# Patient Record
Sex: Female | Born: 1995 | Hispanic: No | Marital: Single | Smoking: Never smoker
Health system: Southern US, Community
[De-identification: ages and names within clinical notes are randomized; demographics above are authoritative.]

## PROBLEM LIST (undated history)

## (undated) ENCOUNTER — Inpatient Hospital Stay (HOSPITAL_COMMUNITY): Payer: Self-pay

## (undated) DIAGNOSIS — K219 Gastro-esophageal reflux disease without esophagitis: Secondary | ICD-10-CM

## (undated) DIAGNOSIS — G43909 Migraine, unspecified, not intractable, without status migrainosus: Secondary | ICD-10-CM

## (undated) DIAGNOSIS — K8020 Calculus of gallbladder without cholecystitis without obstruction: Secondary | ICD-10-CM

## (undated) HISTORY — PX: TUMOR REMOVAL: SHX12

## (undated) HISTORY — DX: Gastro-esophageal reflux disease without esophagitis: K21.9

---
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
## 2013-01-28 ENCOUNTER — Telehealth: Payer: Self-pay | Admitting: Pediatrics

## 2013-01-28 DIAGNOSIS — G43009 Migraine without aura, not intractable, without status migrainosus: Secondary | ICD-10-CM

## 2013-01-28 NOTE — Telephone Encounter (Signed)
Headache calendar from March 2014 on [place]. 31 days were recorded.  0 days were headache free.  8 days were associated with tension type headaches, 8 required treatment.  There were 23 days of migraines, 8 were severe.

## 2013-01-31 MED ORDER — DIVALPROEX SODIUM ER 500 MG PO TB24: 1500.0000 mg | ORAL_TABLET | Freq: Every day | ORAL | Status: DC

## 2013-01-31 NOTE — Telephone Encounter (Signed)
I spoke with mother.  She said that the patient is hungry, but is actually doing fairly well as far as her weight.  Divalproex will be increased to 3 tablets at nighttime.I asked that another calendar be sent for May.

## 2013-02-10 ENCOUNTER — Encounter: Payer: Self-pay | Admitting: *Deleted

## 2013-03-04 ENCOUNTER — Encounter (HOSPITAL_COMMUNITY): Payer: Self-pay | Admitting: Emergency Medicine

## 2013-03-04 ENCOUNTER — Emergency Department (HOSPITAL_COMMUNITY)
Admission: EM | Admit: 2013-03-04 | Discharge: 2013-03-05 | Disposition: A | Discharge status: Home / Self Care | Payer: Medicaid Other | Attending: Emergency Medicine | Admitting: Emergency Medicine

## 2013-03-04 DIAGNOSIS — R110 Nausea: Secondary | ICD-10-CM | POA: Insufficient documentation

## 2013-03-04 DIAGNOSIS — G43909 Migraine, unspecified, not intractable, without status migrainosus: Secondary | ICD-10-CM | POA: Insufficient documentation

## 2013-03-04 DIAGNOSIS — H53149 Visual discomfort, unspecified: Secondary | ICD-10-CM | POA: Insufficient documentation

## 2013-03-04 DIAGNOSIS — Z79899 Other long term (current) drug therapy: Secondary | ICD-10-CM | POA: Insufficient documentation

## 2013-03-04 NOTE — ED Notes (Signed)
PT. REPORTS MIGRAINE HEADACHE FOR 5 DAYS UNRELIEVED BY PRESCRIPTION MIGRAINE MEDICATIONS , DENIES NAUSEA OR VOMITTING .

## 2013-03-05 MED ORDER — KETOROLAC TROMETHAMINE 30 MG/ML IJ SOLN
60.0000 mg | Freq: Once | INTRAMUSCULAR | Status: DC
  Filled 2013-03-05: qty 2

## 2013-03-05 MED ORDER — DIPHENHYDRAMINE HCL 50 MG/ML IJ SOLN
25.0000 mg | Freq: Once | INTRAMUSCULAR | Status: DC
  Filled 2013-03-05: qty 1

## 2013-03-05 MED ORDER — METOCLOPRAMIDE HCL 5 MG/ML IJ SOLN
10.0000 mg | Freq: Once | INTRAMUSCULAR | Status: DC
  Filled 2013-03-05: qty 2

## 2013-03-05 NOTE — ED Provider Notes (Signed)
History     CSN: [id number]  Arrival date & time 03/04/13  2138   First MD Initiated Contact with Patient 03/04/13 2343      Chief Complaint  Patient presents with  . Migraine    (Consider location/radiation/quality/duration/timing/severity/associated sxs/prior treatment) HPI 17 year old female presents to emergency room with report of persistent headache for 5 days.  Patient reports she has daily headaches, usually controlled with ibuprofen.  She also has a history of migraine headaches for which she takes Depakote, Topamax, and Imitrex as needed.  She, reports she's taken her Imitrex for the last 2 days without improvement in her headaches.  Patient denies any fever, no neck stiffness, no URI symptoms.  She reports her current headache is consistent with her normal migraines.  She is followed Dr. Sharene Skeans for same.  She was last seen by him 3 weeks ago for routine checkup.  She contacted his office today, but was unable to get an appointment.  She is complaining of photo and phonophobia, as well as nausea.  She has not had vomiting.  No weakness no numbness or focal neuro deficits.  Past Medical History  Diagnosis Date  . Migraines     Past Surgical History  Procedure Laterality Date  . Tumor removal      L foot    No family history on file.  History  Substance Use Topics  . Smoking status: Never Smoker   . Smokeless tobacco: Not on file  . Alcohol Use: No    OB History   Grav Para Term Preterm Abortions TAB SAB Ect Mult Living                  Review of Systems  See History of Present Illness; otherwise all other systems are reviewed and negative  Allergies  Review of patient's allergies indicates no known allergies.  Home Medications   Current Outpatient Rx  Name  Route  Sig  Dispense  Refill  . divalproex (DEPAKOTE ER) 500 MG 24 hr tablet   Oral   Take 3 tablets (1,500 mg total) by mouth at bedtime.   93 tablet   5   . ibuprofen (ADVIL,MOTRIN) 200 MG  tablet   Oral   Take 400 mg by mouth 3 (three) times daily as needed. For headache         . omeprazole (PRILOSEC) 20 MG capsule   Oral   Take 20 mg by mouth daily.         . ondansetron (ZOFRAN-ODT) 8 MG disintegrating tablet   Oral   Take 8 mg by mouth every 8 (eight) hours as needed for nausea.         . SUMAtriptan (IMITREX) 50 MG tablet   Oral   Take 50 mg by mouth as needed. Take one tablet by mouth at onset of migraine with 400 mg of ibuprofen         . topiramate (TOPAMAX) 25 MG tablet   Oral   Take 50 mg by mouth at bedtime.            BP 124/70  Pulse 82  Temp(Src) 98.8 F (37.1 C) (Oral)  Resp 16  SpO2 98%  Physical Exam  Nursing note and vitals reviewed. Constitutional: She is oriented to person, place, and time. She appears well-developed and well-nourished. No distress.  HENT:  Head: Normocephalic and atraumatic.  Right Ear: External ear normal.  Left Ear: External ear normal.  Nose: Nose normal.  Mouth/Throat: Oropharynx  is clear and moist.  Eyes: Conjunctivae and EOM are normal. Pupils are equal, round, and reactive to light.  Neck: Normal range of motion. Neck supple. No JVD present. No tracheal deviation present. No thyromegaly present.  Cardiovascular: Normal rate, regular rhythm, normal heart sounds and intact distal pulses.  Exam reveals no gallop and no friction rub.   No murmur heard. Pulmonary/Chest: Effort normal and breath sounds normal. No stridor. No respiratory distress. She has no wheezes. She has no rales. She exhibits no tenderness.  Abdominal: Soft. Bowel sounds are normal. She exhibits no distension and no mass. There is no tenderness. There is no rebound and no guarding.  Musculoskeletal: Normal range of motion. She exhibits no edema and no tenderness.  Lymphadenopathy:    She has no cervical adenopathy.  Neurological: She is alert and oriented to person, place, and time. She has normal reflexes. No cranial nerve deficit. She  exhibits normal muscle tone. Coordination normal.  Skin: Skin is warm and dry. No rash noted. She is not diaphoretic. No erythema. No pallor.  Psychiatric: She has a normal mood and affect. Her behavior is normal. Judgment and thought content normal.    ED Course  Procedures (including critical care time)  Labs Reviewed - No data to display No results found.   1. Migraine       MDM  17 year old female with her typical migraine presentation.  Will treat with Reglan Benadryl and Toradol.  Explained to her and her mother that we would most likely not be able to resolve her pain completely, but we would hope to get her metacarpal, so she was able to sleep.       Olivia Mackie, MD 03/05/13 [phone]

## 2013-03-05 NOTE — ED Notes (Signed)
See downtime charting. 

## 2013-03-06 ENCOUNTER — Telehealth: Payer: Self-pay | Admitting: Family

## 2013-03-06 DIAGNOSIS — G43001 Migraine without aura, not intractable, with status migrainosus: Secondary | ICD-10-CM

## 2013-03-06 MED ORDER — VERAPAMIL HCL 40 MG PO TABS: ORAL_TABLET | ORAL | Status: DC

## 2013-03-06 NOTE — Telephone Encounter (Signed)
Vida Roller, NP from [facility] called and said that Megan [name] called her to report that she has had a migraine for 7 days. She went to ER on 5/13 but migraine persists. She is taking Depakote ER 500mg  3 at bedtime and complained that she was gaining weight. Tresa Endo said that Karim[place] wants Dr Sharene Skeans to call her at [phone] to talk about the ongoing headaches and the weight gain. TG

## 2013-03-06 NOTE — Telephone Encounter (Signed)
The patient has gained 8 pounds since she started Depakote.  Most of this happened after we increased the dose from 1000 to1500 mg.  In addition, the patient is experiencing tremors.  She has not experienced any relief from her headaches with Depakote.  I recommended that she discontinue the medication.  We will start her on verapamil 40 mg twice daily and gradually escalate the medication at one week intervals.  As regards to treating her daily headaches we can consider switching to other triptans.  He only other option would be admission to the hospital for DHE protocol.  I described this to the patient and told her to discuss it with her mother.  The most convenient time for her would be to go into the hospital on Friday afternoon after school begins than she will only missed one day of school if the treatment extends into Monday.  She is going to talk about this with her mother and will contact me.

## 2013-03-07 ENCOUNTER — Telehealth: Payer: Self-pay | Admitting: Family

## 2013-03-07 ENCOUNTER — Inpatient Hospital Stay (HOSPITAL_COMMUNITY)
Admission: AD | Admit: 2013-03-07 | Discharge: 2013-03-09 | DRG: 103 | Disposition: A | Discharge status: Home / Self Care | Payer: Medicaid Other | Source: Ambulatory Visit | Attending: Pediatrics | Admitting: Pediatrics

## 2013-03-07 ENCOUNTER — Other Ambulatory Visit: Payer: Self-pay | Admitting: Family

## 2013-03-07 DIAGNOSIS — R161 Splenomegaly, not elsewhere classified: Secondary | ICD-10-CM | POA: Diagnosis present

## 2013-03-07 DIAGNOSIS — G43901 Migraine, unspecified, not intractable, with status migrainosus: Principal | ICD-10-CM | POA: Diagnosis present

## 2013-03-07 DIAGNOSIS — G43001 Migraine without aura, not intractable, with status migrainosus: Secondary | ICD-10-CM

## 2013-03-07 DIAGNOSIS — R110 Nausea: Secondary | ICD-10-CM | POA: Diagnosis present

## 2013-03-07 DIAGNOSIS — R1032 Left lower quadrant pain: Secondary | ICD-10-CM | POA: Diagnosis not present

## 2013-03-07 MED ORDER — DIPHENHYDRAMINE HCL 50 MG/ML IJ SOLN
25.0000 mg | Freq: Three times a day (TID) | INTRAMUSCULAR | Status: DC
  Administered 2013-03-07 – 2013-03-08 (×2): 25 mg via INTRAVENOUS
  Filled 2013-03-07 (×2): qty 1
  Filled 2013-03-07 (×3): qty 0.5

## 2013-03-07 MED ORDER — VERAPAMIL HCL 40 MG PO TABS: ORAL_TABLET | ORAL | Status: DC

## 2013-03-07 MED ORDER — SODIUM CHLORIDE 0.9 % IV SOLN
INTRAVENOUS | Status: DC
  Administered 2013-03-07: 21:00:00 via INTRAVENOUS
  Administered 2013-03-08: 1000 mL via INTRAVENOUS

## 2013-03-07 MED ORDER — DIHYDROERGOTAMINE MESYLATE 1 MG/ML IJ SOLN
1.0000 mg | Freq: Three times a day (TID) | INTRAMUSCULAR | Status: DC
  Administered 2013-03-07 – 2013-03-08 (×4): 1 mg via INTRAVENOUS
  Filled 2013-03-07 (×8): qty 1

## 2013-03-07 MED ORDER — DEXAMETHASONE SODIUM PHOSPHATE 10 MG/ML IJ SOLN: 10.0000 mg | Freq: Three times a day (TID) | INTRAMUSCULAR | Status: DC

## 2013-03-07 MED ORDER — METOCLOPRAMIDE HCL 5 MG/ML IJ SOLN: 10.0000 mg | Freq: Three times a day (TID) | INTRAMUSCULAR | Status: DC

## 2013-03-07 MED ORDER — TOPIRAMATE 25 MG PO TABS
75.0000 mg | ORAL_TABLET | Freq: Every day | ORAL | Status: DC
  Administered 2013-03-07 – 2013-03-08 (×2): 75 mg via ORAL
  Filled 2013-03-07 (×3): qty 3

## 2013-03-07 MED ORDER — ONDANSETRON 4 MG PO TBDP
8.0000 mg | ORAL_TABLET | Freq: Four times a day (QID) | ORAL | Status: DC | PRN
  Administered 2013-03-07: 8 mg via ORAL
  Filled 2013-03-07: qty 2

## 2013-03-07 NOTE — Telephone Encounter (Signed)
[facility] faxed a note to clarify instructions for Rx e-prescribed yesterday by Dr Sharene Skeans. I faxed in new Rx clarifying instructions. TG

## 2013-03-07 NOTE — H&P (Signed)
Pediatric H&P  Patient Details:  Name: Megan [name] MRN: [id number] DOB: 1996/06/22  Chief Complaint   Status Migrainus   History of the Present Illness   Megan [name] is an 17 y.o female with prolonged history of migraines who is presenting in status migrainosus for inpatient administration of DHE protocol.  Pt reports ongoing migraine for ~8 days, 8-9/10 in severity not responsive to home analgesics.  *She has not taken any medications today.*   She can not think of any inciting factors or stress.  She describes her HA as generalized, throbbing, with sensitivity to light and sound and associated with nausea, no emesis.  She does seem to have some component of tension of HA as well, which are usually relieved with rest and analgesia within 1-2 hours, but current HA have been ongoing. She currently takes Topiramate every night, and ibuprofen, sumatriptan, imitrex, and zofran PRN.     ROS: no fever, cough, congestion, no vomiting, no diarrhea.   Endorses poor appetite, but drinking well and has good UOP.  Patient Active Problem List  Active Problems:   Migraine with status migrainosus   Past Birth, Medical & Surgical History   -Full Term.  No complications. -Foot surgery x 2 for removal of benign mass? -Endorses hx of HA since 1 yrs of age.   Developmental History   Normal   Diet History   Regular Diet   Social History   Lives at home with mom and dad.  She is in the 12th grade, will graduate this year.  Parents smoke, she denies any history of tobacco, alcohol, or illicit drug use.  She is sexually active, endorses using protection every time.    Primary Care Provider  Vida Roller, FNP  Home Medications  Medication     Dose Sumatriptan PRN   Topiramate 25 mg tablet  3 tablets qhs   Prilosec   20 mg BID  Imatrex PRN    Ibuprofen   800 mg q 6 PRN  Odansetron  8 mg q 6 PRN    Allergies  No Known Allergies  Immunizations   Up to Date   Family History   Father:  Migraines, DM   Exam  BP 121/74  Pulse 88  Temp(Src) 99.5 F (37.5 C) (Oral)  Resp 17  Ht 5' (1.524 m)  Wt 60.102 kg (132 lb 8 oz)  BMI 25.88 kg/m2  SpO2 100%  LMP 01/31/2013  Ins and Outs:   Weight: 60.102 kg (132 lb 8 oz)   62%ile (Z=0.31) based on CDC 2-20 Years weight-for-age data.  General: pleasant adolescent female in NAD HEENT: MMM, nares patent, no discharge  Neck: supple, normal ROM Lymph nodes: no lymphadenopathy Chest: comfortable WOB, CTAB, no rales or wheezes Heart: nml S1S2, no murmur appreciated, brisk cap refill  Abdomen: soft, normal bowel sounds, NTND, no masses Extremities: warm and well perfused, no edema   Musculoskeletal: good ROM, no joint effusions or edema  Neurological: alert and oriented, pupils equal and reactive, EOMI, sensation intact, strength 5/5, 2+ reflexes, no LE clonus, did not assess gait   Skin: no rashes or lesions   Labs & Studies    Assessment   Megan [name] is an 17 y.o female with prolonged history of migraines who is presenting in status migrainosus for inpatient administration of DHE protocol.  Plan   1.) Status Migrainosus: -Will initiate DHE protocol: Give IV benadryl (25 mg), followed by Reglan (10 mg), wait 10-15 minutes, then administer DHE dihydroergotamine (1 mg),  followed by Decadron (10 mg).  -Assess pain scale after administration.  Can repeat this regimen every 8 hours, for total of 9 treatments.  If pain scale of 0, do not re-dose. -Once pt able to go 16 hrs without DHE, she is stable for discharge.   -Continue home Topamax 75 mg qhs  -Zofran 8 mg q 6 PRN nausea      2.) FEN/GI: -MIVF NS at 100 ml/hr given poor po intake, although well perfused on exam.  -regular diet  -Monitor Is & Os   3.) Dispo: Inpatient for management of Status Migrainosus.    Keith Rake 03/07/2013, 9:23 PM

## 2013-03-07 NOTE — H&P (Signed)
Megan [name] is an 17 year old female admitted by pediatric neurologist, Dr. Ellison Carwin for her first [facility] treatment for status migrainosis.  The patient has a long history of headaches and has an extensive home regimen of treatment. She is alert and in no acute distress. I agree with Dr. Lucita Lora assessment and plan.

## 2013-03-07 NOTE — Progress Notes (Signed)
Pt is  a direct admission from home  To be admitted under Dr Erlinda Hong cormick  E  No admissions orders at this time MD paged . awaiting Md to see  pt made comfortable in bed initial assessment done.

## 2013-03-07 NOTE — Telephone Encounter (Signed)
Mom, Megan [name] called to say that [name] discussed the telephone call and options for treatment with her parents last night. She had a bad night last night with headache and they have decided that she needs to be admitted this weekend for headache treatment. Please call Mom at [phone] to discuss and arrange this. TG

## 2013-03-07 NOTE — Telephone Encounter (Signed)
I called the pediatric floor and there are no spaces at this time.  They will contact me when there is a space.   I spoke with mother and told her that she will need to wait, and I will call her when a room opens up.

## 2013-03-07 NOTE — Progress Notes (Signed)
Pt is a direct admit from home Md paged for admission orders no orders yet initial assessment done and pt oriented to room and made comfortable. Vs stable Azzie Roup RN RN

## 2013-03-08 DIAGNOSIS — R1032 Left lower quadrant pain: Secondary | ICD-10-CM

## 2013-03-08 MED ORDER — DEXAMETHASONE SODIUM PHOSPHATE 10 MG/ML IJ SOLN
10.0000 mg | Freq: Three times a day (TID) | INTRAMUSCULAR | Status: DC
  Administered 2013-03-08 (×2): 10 mg via INTRAVENOUS
  Filled 2013-03-08 (×6): qty 1

## 2013-03-08 MED ORDER — DIPHENHYDRAMINE HCL 25 MG PO CAPS
25.0000 mg | ORAL_CAPSULE | Freq: Three times a day (TID) | ORAL | Status: DC
  Administered 2013-03-08 (×2): 25 mg via ORAL
  Filled 2013-03-08 (×5): qty 1

## 2013-03-08 MED ORDER — METOCLOPRAMIDE HCL 5 MG/ML IJ SOLN
10.0000 mg | Freq: Three times a day (TID) | INTRAMUSCULAR | Status: DC
  Administered 2013-03-08 (×2): 10 mg via INTRAVENOUS
  Filled 2013-03-08 (×7): qty 2

## 2013-03-08 NOTE — Progress Notes (Addendum)
I saw and examined the patient this morning on family centered rounds and I agree with the resident note.  17 yo admitted for DHE, found to have very specific pain in the LLQ and palpable mass, possible splenomegaly, does have a history of viral illness about 1 month ago.  Plan to continue DHE and to get abdominal U/S to further evaluate abdominal pain and possible mass. Kanya Potteiger H 03/08/2013 2:20 PM

## 2013-03-08 NOTE — Progress Notes (Signed)
Subjective: Overnight, Megan [name] continued to complain of headaches with pain ratings between 5-9. She had a pain score of 0 recorded overnight, however, the patient denies ever being pain free since admission although she does think that things are improving. She has had less nausea. She had a BM this morning that she described as liquid.   There was some confusion with the protocol overnight and the patient unfortunately did not receive Decadron or Reglan as part of her DHE protocol. This seems to have been an ordering error. She did, however, receive Benadryl and DHE. She complained of a significant amount of burning at the IV site with the DHE infusion and the IV site was changed. She required one prn dose of Zofran.  Objective: Vital signs in last 24 hours: Temp:  [98 F (36.7 C)-99.6 F (37.6 C)] 98 F (36.7 C) (05/17 1000) Pulse Rate:  [72-88] 80 (05/17 1000) Resp:  [17-18] 17 (05/17 1000) BP: (100-121)/(48-74) 111/67 mmHg (05/17 1000) SpO2:  [99 %-100 %] 99 % (05/17 1000) Weight:  [132 lb 8 oz (60.102 kg)-132 lb 15 oz (60.3 kg)] 132 lb 15 oz (60.3 kg) (05/17 0500) 63%ile (Z=0.33) based on CDC 2-20 Years weight-for-age data.  Physical Exam  Constitutional: She is oriented to person, place, and time. She appears well-developed and well-nourished. No distress.  HENT:  Head: Normocephalic and atraumatic.  Eyes: EOM are normal. Pupils are equal, round, and reactive to light.  Neck: Normal range of motion. Neck supple.  Cardiovascular: Normal rate, regular rhythm and normal heart sounds.   Respiratory: Effort normal and breath sounds normal. She has no wheezes.  GI: Bowel sounds are normal. She exhibits mass. There is splenomegaly. There is no hepatomegaly. There is tenderness. There is guarding. There is no rebound.  Musculoskeletal: Normal range of motion.  Neurological: She is alert and oriented to person, place, and time.  Skin: Skin is warm and dry. No rash noted.  Psychiatric: She  has a normal mood and affect.   Physical Exam  Constitutional: She is oriented to person, place, and time. She appears well-developed and well-nourished. No distress.  HENT:  Head: Normocephalic and atraumatic.  Eyes: EOM are normal. Pupils are equal, round, and reactive to light.  Neck: Normal range of motion. Neck supple.  Cardiovascular: Normal rate, regular rhythm and normal heart sounds.   Pulmonary/Chest: Effort normal and breath sounds normal. She has no wheezes.  Abdominal: Bowel sounds are normal. She exhibits mass. There is splenomegaly. There is no hepatomegaly. There is tenderness. There is guarding. There is no rebound.  Musculoskeletal: Normal range of motion.  Neurological: She is alert and oriented to person, place, and time.  Skin: Skin is warm and dry. No rash noted.  Psychiatric: She has a normal mood and affect.    No current facility-administered medications on file prior to encounter.   Current Outpatient Prescriptions on File Prior to Encounter  Medication Sig Dispense Refill  . ibuprofen (ADVIL,MOTRIN) 200 MG tablet Take 400 mg by mouth 3 (three) times daily as needed for headache.       Marland Kitchen omeprazole (PRILOSEC) 20 MG capsule Take 20 mg by mouth daily.      . ondansetron (ZOFRAN-ODT) 8 MG disintegrating tablet Take 8 mg by mouth every 8 (eight) hours as needed for nausea.      . SUMAtriptan (IMITREX) 50 MG tablet Take 50 mg by mouth every 2 (two) hours as needed for migraine.       . topiramate (TOPAMAX)  25 MG tablet Take 150 mg by mouth at bedtime.        Assessment/Plan:   Megan [name] is an 17 yo female with prolonged history of migraines who is presenting in status migrainosus for inpatient administration of DHE protocol. She is s/p 2 doses although these were incomplete doses as stated above. Is continuing to have pain although her pain ratings have improved to 5 on scale of 10. Will continue protocol as below.   1. Status Migrainosus:  - Continue DHE protocol and  this has been clarified with staff as such:  1. Give PO Benadryl (25mg ) followed by Reglan (10mg ).  2. Wait 10-15 minutes and administer DHE dihydroergotamine (1mg ).  3. Follow with Decadron (10mg ). - Assess pain scale after administration. Can repeat this regimen every 8 hours for a total of 9 treatments. If pain scale of 0, do not re-dose.  - Once patient is able to go 16 hours without DHE, she is stable for discharge.  - Continue home Topamax 75mg  qhs - Zofran 8mg  q8 hrs prn for nausea  2. Abdominal Tenderness: Palpable splenomegaly felt on exam with diffuse tenderness although L>R quadrant. No history of abdominal pathology or hemolytic disease.  - Abdominal U/s to evaluate for HSM or other intra-abdominal process  3. FEN/GI:  - MIVF NS at 100cc/hr given poor PO intake, have encouraged patient to increase fluid intake today - Regular diet as tolerated - Monitor I/Os  4. Dispo/Social:  - Inpatient status for management of Status Migrainosus - Family at bedside and updated on plan of care.    LOS: 1 day   Lonna Cobb 03/08/2013, 1:42 PM

## 2013-03-08 NOTE — Progress Notes (Signed)
UR Completed Quention Mcneill Graves-Bigelow, RN,BSN [phone]  

## 2013-03-09 ENCOUNTER — Inpatient Hospital Stay (HOSPITAL_COMMUNITY): Payer: Medicaid Other

## 2013-03-09 ENCOUNTER — Encounter (HOSPITAL_COMMUNITY): Payer: Self-pay | Admitting: *Deleted

## 2013-03-09 NOTE — Progress Notes (Signed)
Subjective: Overnight, pt had no HA and her pain score was 0, and did not receive her DHE at 6 AM since her score was 0.   Objective: Vital signs in last 24 hours: Temp:  [98 F (36.7 C)-98.5 F (36.9 C)] 98 F (36.7 C) (05/18 1000) Pulse Rate:  [52-77] 62 (05/18 1000) Resp:  [16-17] 17 (05/18 1000) BP: (99-122)/(49-72) 106/49 mmHg (05/18 1000) SpO2:  [98 %-100 %] 100 % (05/18 1000) 63%ile (Z=0.33) based on CDC 2-20 Years weight-for-age data.  Physical Exam  Constitutional: She is oriented to person, place, and time. She appears well-developed and well-nourished. No distress.  HENT:  Head: Normocephalic and atraumatic.  Eyes: EOM are normal. Pupils are equal, round, and reactive to light.  Neck: Normal range of motion. Neck supple.  Cardiovascular: Normal rate, regular rhythm and normal heart sounds.   Respiratory: Effort normal and breath sounds normal. She has no wheezes.  GI: Bowel sounds are normal. She exhibits no mass. There is no splenomegaly or hepatomegaly. There is no tenderness. There is no rebound and no guarding.  Musculoskeletal: Normal range of motion.  Neurological: She is alert and oriented to person, place, and time.  Skin: Skin is warm and dry. No rash noted.  Psychiatric: She has a normal mood and affect.   Physical Exam  Constitutional: She is oriented to person, place, and time. She appears well-developed and well-nourished. No distress.  HENT:  Head: Normocephalic and atraumatic.  Eyes: EOM are normal. Pupils are equal, round, and reactive to light.  Neck: Normal range of motion. Neck supple.  Cardiovascular: Normal rate, regular rhythm and normal heart sounds.   Pulmonary/Chest: Effort normal and breath sounds normal. She has no wheezes.  Abdominal: Bowel sounds are normal. She exhibits no mass. There is no splenomegaly or hepatomegaly. There is no tenderness. There is no rebound and no guarding.  Musculoskeletal: Normal range of motion.  Neurological:  She is alert and oriented to person, place, and time.  Skin: Skin is warm and dry. No rash noted.  Psychiatric: She has a normal mood and affect.    No current facility-administered medications on file prior to encounter.   Current Outpatient Prescriptions on File Prior to Encounter  Medication Sig Dispense Refill  . ibuprofen (ADVIL,MOTRIN) 200 MG tablet Take 400 mg by mouth 3 (three) times daily as needed for headache.       Marland Kitchen omeprazole (PRILOSEC) 20 MG capsule Take 20 mg by mouth daily.      . ondansetron (ZOFRAN-ODT) 8 MG disintegrating tablet Take 8 mg by mouth every 8 (eight) hours as needed for nausea.      . SUMAtriptan (IMITREX) 50 MG tablet Take 50 mg by mouth every 2 (two) hours as needed for migraine.       . topiramate (TOPAMAX) 25 MG tablet Take 150 mg by mouth at bedtime.        Assessment/Plan:   Megan [name] is an 17 yo female with prolonged history of migraines who is presenting in status migrainosus for inpatient administration of DHE protocol. She is s/p 2 doses although these were incomplete doses as stated above. Is continuing to have pain although her pain ratings have improved to 5 on scale of 10. Will continue protocol as below.   1. Status Migrainosus:  - If pt continues to have 0 around 3-4 PM (16 hours w/o protocol), can d/c home.  Otherwise will need to follow below dosing  1. Give PO Benadryl (25mg ) followed by Reglan (  10mg ).  2. Wait 10-15 minutes and administer DHE dihydroergotamine (1mg ).  3. Follow with Decadron (10mg ). - Continue home Topamax 75mg  qhs - Zofran 8mg  q8 hrs prn for nausea  2. Abdominal Tenderness: Palpable splenomegaly felt on exam with diffuse tenderness although L>R quadrant. No history of abdominal pathology or hemolytic disease.  - Abdominal U/s to evaluate for HSM or other intra-abdominal process  3. FEN/GI:  - SLIV  - Regular diet as tolerated - Monitor I/Os  4. Dispo/Social:  - Inpatient status for management of Status Migrainosus.   Hopefully d/c later today if still having no pain for HA - Family at bedside and updated on plan of care.   Megan First Paulina Fusi, DO of [facility] 03/09/2013, 12:38 PM

## 2013-03-09 NOTE — Progress Notes (Signed)
Pt d/c to home by car with family. Assessment stable. Pain score 0. Pt verbalizes understanding of d/c instructions.

## 2013-03-09 NOTE — Progress Notes (Signed)
I saw and evaluated the patient, performing the key elements of the service. I developed the management plan that is described in the resident's note, and I agree with the content except for my abdominal exam findings: mild tenderness to deep palpation in the LLQ.  My detailed findings are in the discharge summary dated today.  [name]                  03/09/2013, 5:44 PM

## 2013-03-09 NOTE — Discharge Summary (Signed)
Pediatric Teaching Program  [address]  [place], [place] [address] Phone: [phone] Fax: [phone]  Patient Details  Name: Megan [name] MRN: [id number] DOB: 09-Jun-1996  DISCHARGE SUMMARY    Dates of Hospitalization: 03/07/2013 to 03/09/2013  Reason for Hospitalization: Status Migrainus  Final Diagnoses: Status Migrainus  Brief Hospital Course:  Pt presented to the hospital in status migrainus sent over for a direct admission from neurology clinic for DHE protocol.  Pt had tried her abortive Imitrex at home for several days without much improvement.  Upon arrival to the hospital, she received the benadryl followed by reglan, 15 minutes later DHE followed by decadron.  She was continued on home zofran for nausea and topamax 75 mg qhs.  Pt slowly improved and received 5 doses prior to being HA free for 16 hours.  Pt stable at d/c and case discussed with on call neurologist Dr. Devonne Doughty who did not have further recommendations at this time.    She had LLQ on the second day of hospitalization and her exam at that time was concerning for splenomegaly.  She had an abdominal ultrasound with normal spleen size but incidental findings of non-obstructive nephrolithiasis and a gallbladder polyp versus gallstone.  Will send report to primary MD in case she develops symptoms related to the above.  Discharge Weight: 132 lb 15 oz (60.3 kg)   Discharge Condition: Improved  Discharge Diet: Resume diet  Discharge Activity: Ad lib   OBJECTIVE FINDINGS at Discharge:  Temp:  [98 F (36.7 C)-98.5 F (36.9 C)] 98.5 F (36.9 C) (05/18 1400) Pulse Rate:  [52-77] 70 (05/18 1400) Resp:  [16-17] 16 (05/18 1400) BP: (99-122)/(49-72) 109/65 mmHg (05/18 1400) SpO2:  [98 %-100 %] 100 % (05/18 1400) Constitutional: NAD HENT:  Head: Normocephalic and atraumatic.  Eyes: EOM are normal. Pupils are equal, round, and reactive to light.  Neck: Normal range of motion Cardiovascular: RRR, no murmurs  appreciated   Pulmonary/Chest: Effort normal and breath sounds normal. She has no wheezes.  Abdominal: NABS, no HSM, Soft/NT/ND Musculoskeletal: Normal range of motion.  Neurological: She is alert and oriented to person, place, and time.  No focal deficits Skin: Skin is warm and dry. No rash noted.  Psychiatric: She has a normal mood and affect.   Procedures/Operations: None Consultants: Pediatric Neurology (Dr. Sharene Skeans)   Labs/Imaging: IMPRESSION:  1. No evidence of splenomegaly.  2. Possible left-sided nephrolithiasis, nonobstructive.  3. Probable gallbladder polyp versus less likely adherent non-  shadowing stone.   Discharge Medication List    Medication List    TAKE these medications       ibuprofen 200 MG tablet  Commonly known as:  ADVIL,MOTRIN  Take 400 mg by mouth 3 (three) times daily as needed for headache.     omeprazole 20 MG capsule  Commonly known as:  PRILOSEC  Take 20 mg by mouth daily.     ondansetron 8 MG disintegrating tablet  Commonly known as:  ZOFRAN-ODT  Take 8 mg by mouth every 8 (eight) hours as needed for nausea.     SUMAtriptan 50 MG tablet  Commonly known as:  IMITREX  Take 50 mg by mouth every 2 (two) hours as needed for migraine.     topiramate 25 MG tablet  Commonly known as:  TOPAMAX  Take 150 mg by mouth at bedtime.        Immunizations Given (date): none Pending Results: none  Follow Up Issues/Recommendations: 1) Migraine status w/ PPx medication and abortive experience  2) Consider repeat US of GB for 3 mm polyp   Follow-up Information   Follow up with Vida Roller, FNP. Schedule an appointment as soon as possible for a visit in 1 week.      Follow up with Deetta Perla, MD. Schedule an appointment as soon as possible for a visit in 1 week.   Contact information:   [address] [place] [address] [phone]      Twana First. Hess, DO of [facility] 03/09/2013, 4:42 PM  I  examined Tyree and agree with the summary above with the changes I have made. Dyann Ruddle, MD 03/09/2013 5:48 PM

## 2013-03-11 ENCOUNTER — Telehealth: Payer: Self-pay | Admitting: *Deleted

## 2013-03-11 NOTE — Telephone Encounter (Signed)
Megan [name] patient's mom called and stated that Megan [name] was admitted to the hospital on Sat. Due to severe headache she was given the DHE cocktail which worked and she was released from the hospital on Sunday. Mom says that the headache returned on Monday morning, Megan [name] went to school on Monday but she had to pick her up from school around 1:00-1:30 pm she was also nauseated and her level was a 4. Today the patient did not go to school she is still in the bed and currently has a level 4 headache with nausea and she says that she can't open her eyes, she took 2 200 mg ibuprofen around 8:00 am this morning, she said that it helped some but not much. Mom also said that when the patient was in the hospital she told the nurse that she didn't have a headache but that her eyes were hurting. Today she has a headache and eye pain and she says the pain is so bad that she just wants to keep her eyes shut. Megan [name] has an appointment today at 3:00 pm today with Megan Roller, NP at [facility] at [facility] location. Megan [name] can be reached at [phone] and she would like for Megan [name] to give her a call back to discuss this matter. Thanks, Megan [name].

## 2013-03-11 NOTE — Telephone Encounter (Signed)
I spoke with mother for 2 minutes.  The patient apparently started verapamil on Sunday after she was discharged from the hospital.  She neglected to mention that to Dr. Hennie Duos When she was seen at clinic today.  I apparently will see her tomorrow at 9:15.  We will discuss treatment options further.

## 2013-03-12 ENCOUNTER — Telehealth: Payer: Self-pay | Admitting: Pediatrics

## 2013-03-12 NOTE — Telephone Encounter (Signed)
Patient was seen today a [facility].

## 2013-03-13 ENCOUNTER — Telehealth: Payer: Self-pay | Admitting: Pediatrics

## 2013-03-13 NOTE — Telephone Encounter (Signed)
Headache calendar from April 2014 on [place]. 30 days were recorded.  0 days were headache free.  5 days were associated with tension type headaches, 5 required treatment.  There were 25 days of migraines, 7 were severe.  I saw the patient in clinic on May 21.  We are adjusting verapamil.  There is very little else that we can do at this time.

## 2013-07-30 ENCOUNTER — Ambulatory Visit (INDEPENDENT_AMBULATORY_CARE_PROVIDER_SITE_OTHER): Payer: Medicaid Other

## 2013-07-30 VITALS — BP 122/54 | HR 70 | Resp 20 | Ht 62.0 in | Wt 148.6 lb

## 2013-07-30 DIAGNOSIS — M722 Plantar fascial fibromatosis: Secondary | ICD-10-CM

## 2013-07-30 NOTE — Progress Notes (Signed)
  Subjective:    Patient ID: Megan [name], female    DOB: 1996-06-01, 17 y.o.   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 Well-healed no complaint no dehiscence or discharge.    Review of Systems  Constitutional: Positive for unexpected weight change.  Eyes: Positive for pain.  Respiratory: Negative.   Cardiovascular: Negative.   Gastrointestinal: Negative.   Endocrine: Negative.   Genitourinary: Negative.   Musculoskeletal: Negative.   Skin: Negative.   Allergic/Immunologic: Negative.   Neurological: Positive for headaches.  Hematological: Negative.   Psychiatric/Behavioral: Negative.        Objective:   Physical Exam Neurovascular status is intact. Pedal pulses are palpable. Epicritic and proprioceptive sensations intact and symmetric bilateral. The incision plantar left arch is well coapted no pain tenderness or discomfort noted by patient. Slight paresthesia on palpation and percussion. Some swelling in the fascia still present consistent with postop course. No new nodules or identified are palpated. Patient is doing all her normal activities without restrictions.       Assessment & Plan:  Good postop progress is noted no complaint by patient. Patient is instructed in application of Aspercreme or Neosporin with lidocaine the incision area. Maintain cross fiber massage and ice to the incision area. Maintain compression wrap anklet. Reappointed in 3-4 months for long-term followup.  Alvan Dame DPM

## 2013-07-30 NOTE — Patient Instructions (Signed)
Apply lotion to left foot daily. Stretched ligaments of the left arch several times daily as indicated. Apply topical Aspercreme if any pain persists at the incision site.. Continue to apply ice if there is any aching or throbbing.  ICE INSTRUCTIONS  Apply ice or cold pack to the affected area at least 3 times a day for 10-15 minutes each time.  You should also use ice after prolonged activity or vigorous exercise.  Do not apply ice longer than 20 minutes at one time.  Always keep a cloth between your skin and the ice pack to prevent burns.  Being consistent and following these instructions will help control your symptoms.  We suggest you purchase a gel ice pack because they are reusable and do bit leak.  Some of them are designed to wrap around the area.  Use the method that works best for you.  Here are some other suggestions for icing.   Use a frozen bag of peas or corn-inexpensive and molds well to your body, usually stays frozen for 10 to 20 minutes.  Wet a towel with cold water and squeeze out the excess until it's damp.  Place in a bag in the freezer for 20 minutes. Then remove and use.

## 2013-08-28 ENCOUNTER — Other Ambulatory Visit: Payer: Self-pay

## 2013-11-28 ENCOUNTER — Emergency Department (HOSPITAL_COMMUNITY)
Admission: EM | Admit: 2013-11-28 | Discharge: 2013-11-28 | Disposition: A | Discharge status: Home / Self Care | Payer: Medicaid Other | Attending: Emergency Medicine | Admitting: Emergency Medicine

## 2013-11-28 ENCOUNTER — Encounter (HOSPITAL_COMMUNITY): Payer: Self-pay | Admitting: Emergency Medicine

## 2013-11-28 DIAGNOSIS — R519 Headache, unspecified: Secondary | ICD-10-CM

## 2013-11-28 DIAGNOSIS — Z79899 Other long term (current) drug therapy: Secondary | ICD-10-CM | POA: Insufficient documentation

## 2013-11-28 DIAGNOSIS — J029 Acute pharyngitis, unspecified: Secondary | ICD-10-CM | POA: Insufficient documentation

## 2013-11-28 DIAGNOSIS — R51 Headache: Secondary | ICD-10-CM

## 2013-11-28 DIAGNOSIS — K219 Gastro-esophageal reflux disease without esophagitis: Secondary | ICD-10-CM | POA: Insufficient documentation

## 2013-11-28 DIAGNOSIS — G43909 Migraine, unspecified, not intractable, without status migrainosus: Secondary | ICD-10-CM | POA: Insufficient documentation

## 2013-11-28 LAB — RAPID STREP SCREEN (MED CTR MEBANE ONLY): Streptococcus, Group A Screen (Direct): NEGATIVE

## 2013-11-28 MED ORDER — DEXAMETHASONE SODIUM PHOSPHATE 10 MG/ML IJ SOLN
10.0000 mg | Freq: Once | INTRAMUSCULAR | Status: AC
  Administered 2013-11-28: 10 mg via INTRAVENOUS
  Filled 2013-11-28: qty 1

## 2013-11-28 MED ORDER — ONDANSETRON HCL 4 MG/2ML IJ SOLN
4.0000 mg | Freq: Once | INTRAMUSCULAR | Status: AC
  Administered 2013-11-28: 4 mg via INTRAVENOUS
  Filled 2013-11-28: qty 2

## 2013-11-28 MED ORDER — DIPHENHYDRAMINE HCL 50 MG/ML IJ SOLN
25.0000 mg | Freq: Once | INTRAMUSCULAR | Status: AC
  Administered 2013-11-28: 25 mg via INTRAVENOUS
  Filled 2013-11-28: qty 1

## 2013-11-28 MED ORDER — KETOROLAC TROMETHAMINE 30 MG/ML IJ SOLN
30.0000 mg | Freq: Once | INTRAMUSCULAR | Status: AC
  Administered 2013-11-28: 30 mg via INTRAVENOUS
  Filled 2013-11-28: qty 1

## 2013-11-28 MED ORDER — METOCLOPRAMIDE HCL 5 MG/ML IJ SOLN
10.0000 mg | Freq: Once | INTRAMUSCULAR | Status: AC
  Administered 2013-11-28: 10 mg via INTRAVENOUS
  Filled 2013-11-28: qty 2

## 2013-11-28 MED ORDER — SODIUM CHLORIDE 0.9 % IV BOLUS (SEPSIS)
1000.0000 mL | Freq: Once | INTRAVENOUS | Status: AC
  Administered 2013-11-28: 1000 mL via INTRAVENOUS

## 2013-11-28 NOTE — ED Notes (Signed)
PT c/o migraine (she describes as typical) and sore throat x 3 days.  Now c/o difficulty eating.

## 2013-11-28 NOTE — Discharge Instructions (Signed)
Migraine Headache A migraine headache is an intense, throbbing pain on one or both sides of your head. A migraine can last for 30 minutes to several hours. CAUSES  The exact cause of a migraine headache is not always known. However, a migraine may be caused when nerves in the brain become irritated and release chemicals that cause inflammation. This causes pain. Certain things may also trigger migraines, such as:  Alcohol.  Smoking.  Stress.  Menstruation.  Aged cheeses.  Foods or drinks that contain nitrates, glutamate, aspartame, or tyramine.  Lack of sleep.  Chocolate.  Caffeine.  Hunger.  Physical exertion.  Fatigue.  Medicines used to treat chest pain (nitroglycerine), birth control pills, estrogen, and some blood pressure medicines. SIGNS AND SYMPTOMS  Pain on one or both sides of your head.  Pulsating or throbbing pain.  Severe pain that prevents daily activities.  Pain that is aggravated by any physical activity.  Nausea, vomiting, or both.  Dizziness.  Pain with exposure to bright lights, loud noises, or activity.  General sensitivity to bright lights, loud noises, or smells. Before you get a migraine, you may get warning signs that a migraine is coming (aura). An aura may include:  Seeing flashing lights.  Seeing bright spots, halos, or zig-zag lines.  Having tunnel vision or blurred vision.  Having feelings of numbness or tingling.  Having trouble talking.  Having muscle weakness. DIAGNOSIS  A migraine headache is often diagnosed based on:  Symptoms.  Physical exam.  A CT scan or MRI of your head. These imaging tests cannot diagnose migraines, but they can help rule out other causes of headaches. TREATMENT Medicines may be given for pain and nausea. Medicines can also be given to help prevent recurrent migraines.  HOME CARE INSTRUCTIONS  Only take over-the-counter or prescription medicines for pain or discomfort as directed by your  health care provider. The use of long-term narcotics is not recommended.  Lie down in a dark, quiet room when you have a migraine.  Keep a journal to find out what may trigger your migraine headaches. For example, write down:  What you eat and drink.  How much sleep you get.  Any change to your diet or medicines.  Limit alcohol consumption.  Quit smoking if you smoke.  Get 7 9 hours of sleep, or as recommended by your health care provider.  Limit stress.  Keep lights dim if bright lights bother you and make your migraines worse. SEEK IMMEDIATE MEDICAL CARE IF:   Your migraine becomes severe.  You have a fever.  You have a stiff neck.  You have vision loss.  You have muscular weakness or loss of muscle control.  You start losing your balance or have trouble walking.  You feel faint or pass out.  You have severe symptoms that are different from your first symptoms. MAKE SURE YOU:   Understand these instructions.  Will watch your condition.  Will get help right away if you are not doing well or get worse. Document Released: 10/09/2005 Document Revised: 07/30/2013 Document Reviewed: 06/16/2013 [employer] Patient Information 2014 [employer].  

## 2013-11-28 NOTE — ED Provider Notes (Signed)
CSN: [id number]     Arrival date & time 11/28/13  0722 History   First MD Initiated Contact with Patient 11/28/13 [phone]     Chief Complaint  Patient presents with  . Sore Throat  . Migraine   (Consider location/radiation/quality/duration/timing/severity/associated sxs/prior Treatment) HPI Comments: HA similar to prior migraine, tried triptan and zofran therapy a few days ago without relief.   Patient is a 18 y.o. female presenting with pharyngitis and migraines. The history is provided by the patient.  Sore Throat This is a new problem. The current episode started yesterday. The problem occurs constantly. Associated symptoms include headaches. Pertinent negatives include no chest pain, no abdominal pain and no shortness of breath. Nothing aggravates the symptoms. Nothing relieves the symptoms.  Migraine This is a recurrent problem. The current episode started more than 2 days ago. The problem occurs constantly. The problem has not changed since onset.Associated symptoms include headaches. Pertinent negatives include no chest pain, no abdominal pain and no shortness of breath. Nothing aggravates the symptoms. Nothing relieves the symptoms. She has tried nothing for the symptoms.    Past Medical History  Diagnosis Date  . Migraines   . GERD (gastroesophageal reflux disease)    Past Surgical History  Procedure Laterality Date  . Tumor removal      L foot   Family History  Problem Relation Age of Onset  . Heart disease Father   . Hypertension Father   . Diabetes Father   . Stroke Father   . Depression Father    History  Substance Use Topics  . Smoking status: Never Smoker   . Smokeless tobacco: Not on file  . Alcohol Use: No   OB History   Grav Para Term Preterm Abortions TAB SAB Ect Mult Living                 Review of Systems  Constitutional: Negative for fever.  Respiratory: Negative for cough and shortness of breath.   Cardiovascular: Negative for chest pain.   Gastrointestinal: Positive for nausea. Negative for vomiting and abdominal pain.  Neurological: Positive for headaches.  All other systems reviewed and are negative.    Allergies  Review of patient's allergies indicates no known allergies.  Home Medications   Current Outpatient Rx  Name  Route  Sig  Dispense  Refill  . frovatriptan (FROVA) 2.5 MG tablet   Oral   Take 2.5 mg by mouth as needed for migraine. If recurs, may repeat after 2 hours. Max of 3 tabs in 24 hours.         Marland Kitchen. ibuprofen (ADVIL,MOTRIN) 200 MG tablet   Oral   Take 400 mg by mouth 3 (three) times daily as needed for headache.          Marland Kitchen. omeprazole (PRILOSEC) 20 MG capsule   Oral   Take 20 mg by mouth daily.         . ondansetron (ZOFRAN-ODT) 8 MG disintegrating tablet   Oral   Take 8 mg by mouth every 8 (eight) hours as needed for nausea.         . SUMAtriptan (IMITREX) 50 MG tablet   Oral   Take 50 mg by mouth every 2 (two) hours as needed for migraine.          . topiramate (TOPAMAX) 25 MG tablet   Oral   Take 150 mg by mouth at bedtime.          . verapamil (CALAN) 40 MG  tablet   Oral   Take 40 mg by mouth 2 (two) times daily before a meal.          BP 134/88  Pulse 117  Temp(Src) 99.1 F (37.3 C) (Oral)  Resp 20  SpO2 99% Physical Exam  Nursing note and vitals reviewed. Constitutional: She is oriented to person, place, and time. She appears well-developed and well-nourished. No distress.  HENT:  Head: Normocephalic and atraumatic.  Mouth/Throat: Mucous membranes are not pale. Posterior oropharyngeal erythema present. No oropharyngeal exudate, posterior oropharyngeal edema or tonsillar abscesses.  Eyes: EOM are normal. Pupils are equal, round, and reactive to light.  Neck: Normal range of motion. Neck supple.  Cardiovascular: Normal rate and regular rhythm.  Exam reveals no friction rub.   No murmur heard. Pulmonary/Chest: Effort normal and breath sounds normal. No  respiratory distress. She has no wheezes. She has no rales.  Abdominal: Soft. She exhibits no distension. There is no tenderness. There is no rebound.  Musculoskeletal: Normal range of motion. She exhibits no edema.  Neurological: She is alert and oriented to person, place, and time.  Skin: She is not diaphoretic.    ED Course  Procedures (including critical care time) Labs Review Labs Reviewed  RAPID STREP SCREEN  CULTURE, GROUP A STREP   Imaging Review No results found.  EKG Interpretation   None       MDM   1. Headache   2. Pharyngitis    56F with hx of chronic headaches presents with typical migraine. Longstanding migraines, no relief with abortive therapy. Gradual onset 3 days ago, constant. No photophobia, no vomiting. Sore throat started yesterday. Mild rhinorrhea. No coughing, no diarrhea, no abdominal pain. No vision problems. Afebrile, tachycardic. Normal neurologic exam, lungs clear, abdomen benign. Will give migraine cocktail, swab for strep.  Strep negative. Feeling much better after migraine cocktail, stable for discharge.   Dagmar Hait, MD 11/28/13 [phone]

## 2013-11-30 LAB — CULTURE, GROUP A STREP

## 2014-06-01 ENCOUNTER — Encounter (HOSPITAL_COMMUNITY): Payer: Self-pay | Admitting: Emergency Medicine

## 2014-06-01 ENCOUNTER — Emergency Department (HOSPITAL_COMMUNITY)
Admission: EM | Admit: 2014-06-01 | Discharge: 2014-06-01 | Disposition: A | Discharge status: Home / Self Care | Payer: Medicaid Other | Attending: Emergency Medicine | Admitting: Emergency Medicine

## 2014-06-01 DIAGNOSIS — T23009A Burn of unspecified degree of unspecified hand, unspecified site, initial encounter: Secondary | ICD-10-CM | POA: Insufficient documentation

## 2014-06-01 DIAGNOSIS — Y990 Civilian activity done for income or pay: Secondary | ICD-10-CM | POA: Insufficient documentation

## 2014-06-01 DIAGNOSIS — X131XXA Other contact with steam and other hot vapors, initial encounter: Secondary | ICD-10-CM

## 2014-06-01 DIAGNOSIS — Y9389 Activity, other specified: Secondary | ICD-10-CM | POA: Insufficient documentation

## 2014-06-01 DIAGNOSIS — G43909 Migraine, unspecified, not intractable, without status migrainosus: Secondary | ICD-10-CM | POA: Insufficient documentation

## 2014-06-01 DIAGNOSIS — Y9289 Other specified places as the place of occurrence of the external cause: Secondary | ICD-10-CM | POA: Insufficient documentation

## 2014-06-01 DIAGNOSIS — K219 Gastro-esophageal reflux disease without esophagitis: Secondary | ICD-10-CM | POA: Insufficient documentation

## 2014-06-01 DIAGNOSIS — X12XXXA Contact with other hot fluids, initial encounter: Secondary | ICD-10-CM | POA: Insufficient documentation

## 2014-06-01 DIAGNOSIS — T23101A Burn of first degree of right hand, unspecified site, initial encounter: Secondary | ICD-10-CM

## 2014-06-01 DIAGNOSIS — T23139A Burn of first degree of unspecified multiple fingers (nail), not including thumb, initial encounter: Secondary | ICD-10-CM | POA: Insufficient documentation

## 2014-06-01 DIAGNOSIS — Z79899 Other long term (current) drug therapy: Secondary | ICD-10-CM | POA: Insufficient documentation

## 2014-06-01 MED ORDER — SILVER SULFADIAZINE 1 % EX CREA: 1.0000 "application " | TOPICAL_CREAM | Freq: Every day | CUTANEOUS | Status: DC

## 2014-06-01 MED ORDER — IBUPROFEN 800 MG PO TABS: 800.0000 mg | ORAL_TABLET | Freq: Three times a day (TID) | ORAL | Status: DC

## 2014-06-01 MED ORDER — IBUPROFEN 400 MG PO TABS
800.0000 mg | ORAL_TABLET | Freq: Once | ORAL | Status: AC
  Administered 2014-06-01: 800 mg via ORAL
  Filled 2014-06-01: qty 2

## 2014-06-01 NOTE — ED Notes (Signed)
Declined W/C at D/C and was escorted to lobby by RN. 
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
## 2014-06-01 NOTE — Discharge Instructions (Signed)
Burn Care Your skin is a natural barrier to infection. It is the largest organ of your body. Burns damage this natural protection. To help prevent infection, it is very important to follow your caregiver's instructions in the care of your burn. Burns are classified as:  First degree. There is only redness of the skin (erythema). No scarring is expected.  Second degree. There is blistering of the skin. Scarring may occur with deeper burns.  Third degree. All layers of the skin are injured, and scarring is expected. HOME CARE INSTRUCTIONS   Wash your hands well before changing your bandage.  Change your bandage as often as directed by your caregiver.  Remove the old bandage. If the bandage sticks, you may soak it off with cool, clean water.  Cleanse the burn thoroughly but gently with mild soap and water.  Pat the area dry with a clean, dry cloth.  Apply a thin layer of antibacterial cream to the burn.  Apply a clean bandage as instructed by your caregiver.  Keep the bandage as clean and dry as possible.  Elevate the affected area for the first 24 hours, then as instructed by your caregiver.  Only take over-the-counter or prescription medicines for pain, discomfort, or fever as directed by your caregiver. SEEK IMMEDIATE MEDICAL CARE IF:   You develop excessive pain.  You develop redness, tenderness, swelling, or red streaks near the burn.  The burned area develops yellowish-white fluid (pus) or a bad smell.  You have a fever. MAKE SURE YOU:   Understand these instructions.  Will watch your condition.  Will get help right away if you are not doing well or get worse. Document Released: 10/09/2005 Document Revised: 01/01/2012 Document Reviewed: 03/01/2011 [facility] Patient Information 2015 [employer], [place]. This information is not intended to replace advice given to you by your health care provider. Make sure you discuss any questions you have with your health care  provider.   Emergency Department Resource Guide 1) Find a Doctor and Pay Out of Pocket Although you won't have to find out who is covered by your insurance plan, it is a good idea to ask around and get recommendations. You will then need to call the office and see if the doctor you have chosen will accept you as a new patient and what types of options they offer for patients who are self-pay. Some doctors offer discounts or will set up payment plans for their patients who do not have insurance, but you will need to ask so you aren't surprised when you get to your appointment.  2) Contact Your Local Health Department Not all health departments have doctors that can see patients for sick visits, but many do, so it is worth a call to see if yours does. If you don't know where your local health department is, you can check in your phone book. The CDC also has a tool to help you locate your state's health department, and many state websites also have listings of all of their local health departments.  3) Find a Walk-in Clinic If your illness is not likely to be very severe or complicated, you may want to try a walk in clinic. These are popping up all over the country in pharmacies, drugstores, and shopping centers. They're usually staffed by nurse practitioners or physician assistants that have been trained to treat common illnesses and complaints. They're usually fairly quick and inexpensive. However, if you have serious medical issues or chronic medical problems, these are probably not  your best option.  No Primary Care Doctor: - Call Health Connect at  [phone] - they can help you locate a primary care doctor that  accepts your insurance, provides certain services, etc. - Physician Referral Service- [phone]  Chronic Pain Problems: Organization         Address  Phone   Notes  [facility]  [phone] Patients need to be referred by their primary care doctor.    Medication Assistance: Organization         Address  Phone   Notes  [facility] Medication [facility] [address] [place], [place] [address] [phone]) [phone] --Must be a resident of [facility] -- Must have NO insurance coverage whatsoever (no Medicaid/ Medicare, etc.) -- The pt. MUST have a primary care doctor that directs their care regularly and follows them in the community   MedAssist  [phone]) [phone]   [employer]  [phone]    Agencies that provide inexpensive medical care: Organization         Address  Phone   Notes  [facility]  [phone]) [phone]   [facility]    [phone]   [facility] [address] [place], [place] [address] [phone]   [facility] [place] [address], [place] [phone]   Planned Parenthood    [phone](336) [phone]   [facility]    [phone]   [facility]  [address], [place] Phone:  [phone], Fax:  [phone] Hours of Operation:  9 am - 6 pm, M-F.  Also accepts Medicaid/Medicare and self-pay.  [facility]  [address], [place] Phone: [phone], Fax: [phone]. Hours of Operation:  8:30 am - 5:30 pm, M-F.  Also accepts Medicaid and self-pay.  [facility] [place] [address], [place] Phone: [phone]) [phone]   [facility] [address], [place], [place] [phone], Ext. [phone] Mondays & Thursdays: 7-9 AM.  First 15 patients are seen on a first come, first serve basis.    Medicaid-accepting [facility] Providers:  Organization         Address  Phone   Notes  [facility] [address], [place] [phone] Also accepts self-pay patients.  [facility] [address], [place]  [phone]   [facility] [address], [place] [phone]) [phone]   [facility] [address] [place] [address], [place] [phone]   Renaye RakersVeita Bland [address], [place]   [phone] Only accepts [place] Access [place]icaid patients after they have their name applied to their card.   Self-Pay (no insurance) in [facility]:  Organization         Address  Phone   Notes  Sickle Cell Patients, [facility] [address], [place] [phone]   [facility] [address], [place] [phone]   [facility] [place]  [address], [place] [phone]   [facility]/Dr. Osei-Bonsu  [address] [place] [address], [place] or [phone], [place] [phone] Phone number for both [place] and [place] locations is the same.  [facility] [address], [place] [phone]   [facility] [place] [address] [place] [address], [place] or [address] [phone]   [facility] [address], [place] [phone]) [phone], phone; [phone], fax Sees patients 1st and 3rd Saturday of every month.  Must not qualify for public or private insurance (i.e. Medicaid, Medicare, [place] Health Choice, Veterans' Benefits)  Household income should be no more than 200% of the poverty level The clinic cannot treat you if you are pregnant or think you are pregnant  Sexually transmitted diseases are not treated at the clinic.    Dental Care: Organization         Address  Phone  Notes  [facility] of [facility] [address], [place] [phone]) [phone] Accepts children up to age 20 who are enrolled in [place]icaid or [place] Health Choice; pregnant women with a Medicaid card; and children who have applied for Medicaid or [place] Health Choice, but were declined, whose parents can pay a reduced fee at time of service.   [facility] of [facility] [place]  [address], [place] [phone] Accepts children up to age 20 who are enrolled in [place]icaid or [place] Health Choice; pregnant women with a Medicaid card; and children who have applied for Medicaid or  Health Choice, but were declined, whose parents can pay a reduced fee at time of service.  [facility]  [address], [place] [phone]) [phone] Patients are seen by appointment only. Walk-ins are not accepted. [facility] will see patients 20 years of age and older. Monday - Tuesday (8am-5pm) Most Wednesdays (8:30-5pm) $30 per visit, cash only  [facility]  [address], [facility] [phone] Patients are seen by appointment only. Walk-ins are not accepted. [facility] will see patients 20 years of age and older. One Wednesday Evening (Monthly: Volunteer Based).  $30 per visit, cash only  [facility]  [phone] for adults; Children under age 594, call Graduate Pediatric Dentistry at [phone]. Children aged 994-14, please call [phone] to request a pediatric application.  Dental services are provided in all areas of dental care including fillings, crowns and bridges, complete and partial dentures, implants, gum treatment, root canals, and extractions. Preventive care is also provided. Treatment is provided to both adults and children. Patients are selected via a lottery and there is often a waiting list.   [facility] [address], [place]  [phone] [email or web address]   [facility] [address], [place], [place] [phone], Ext. [phone] Second and Fourth Thursday of each month, opens at 6:30 AM; Clinic ends at 9 AM.  Patients are seen on a first-come first-served basis, and a limited number are seen during each clinic.   [facility]  [address], [place], [place] [phone])  [phone]   Eligibility Requirements You must have lived in [place], [place], or [place] counties for at least the last three months.   You cannot be eligible for state or federal sponsored National Cityhealthcare insurance, including CIGNAVeterans Administration, [place]icaid, or Harrah[employer] EntertainmentMedicare.   You generally cannot be eligible for healthcare insurance through your employer.    How to apply: Eligibility screenings are held every Tuesday and Wednesday afternoon from 1:00 pm until 4:00 pm. You do not need an appointment for the interview!  [facility] [address] [place]  Barbara Cower [place], [place]   [place]  [place] Department  [place]  [phone]    Behavioral Health Resources in the Community: Intensive Outpatient Programs Organization         Address  Phone  Notes  [place]. [address] [place], [place] [phone]   [facility]patient [address] [place], [place]   ADS: Alcohol & Drug Svcs [address], [place], [place]   [place] [address],  [place], [place] or [phone]   Substance Abuse Resources Organization         Address  Phone  Notes  Alcohol and Drug Services  [phone]   [place]  [phone]   [facility] [place]   Chinita Pester  [phone]   Residential & Outpatient Substance Abuse Program  [phone]   Psychological Services Organization         Address  Phone  Notes  [facility] [place]  [place]  [phone]   [place] [address], [place] or [phone]    Mobile Crisis Teams Organization         Address  Phone  Notes  [facility], Mobile Crisis Care Unit  [phone]   [facility]ive Psychotherapeutic Services  [address] [place], [place]   [place] Levels [address], [place] [phone]    Self-Help/Support Groups Organization         Address  Phone             Notes  [place]. of [place] - variety of support groups  Chauncey Call for more information  Narcotics Anonymous (NA), Caring Services [address], [employer] [place]  2 meetings at this location   Special educational needs teacher         Address  Phone  Notes  ASAP Residential Treatment [place],    [place]  [phone]   [facility]  [address], [place] 315945, [place], [place]   [place], [place] [phone] Admissions: 8am-3pm M-F  Incentives Substance [place] [address]    [place], [place] [phone]   [facility] [address] [place], [place], [place]   [facility] [address]  [place], [place]   [facility]ight Programs - Intensive Outpatient [address], [place], [place]   [facility] ([place].) [place].,  [place], [place] [phone] or [phone]   [facility] Services (RTS) [address] [place], [place] Accepts Medicaid  [facility] [place] [address]  [place] [phone] Substance Abuse/Addiction Treatment   [facility] Organization         Address  Phone  Notes  [facility]  [phone]   Domenic Schwab, PhD [address] Arlis Porta [place], [place]   [phone] or [phone]   [place], [place] [phone]   [place] [address], [place], [place] [phone] Insurance/Medicaid/sponsorship through [employer] and Families [address] HFG [address]  [place], [place] [phone]  [place], [place] [phone]    Dr. Adele Schilder  [phone]   [facility] Dept. 1) [address], [place] 2) [place] 3)  [place] [address], [place] [phone]  [phone]   [place] [phone] or [phone] (After Hours)

## 2014-06-02 NOTE — ED Provider Notes (Signed)
Medical screening examination/treatment/procedure(s) were performed by non-physician practitioner and as supervising physician I was immediately available for consultation/collaboration.   EKG Interpretation None       Martha K Linker, MD 06/02/14 1510 

## 2014-08-02 ENCOUNTER — Emergency Department (HOSPITAL_COMMUNITY)
Admission: EM | Admit: 2014-08-02 | Discharge: 2014-08-02 | Discharge status: Against Medical Advice | Payer: Medicaid Other | Attending: Emergency Medicine | Admitting: Emergency Medicine

## 2014-08-02 ENCOUNTER — Encounter (HOSPITAL_COMMUNITY): Payer: Self-pay | Admitting: Emergency Medicine

## 2014-08-02 DIAGNOSIS — G43909 Migraine, unspecified, not intractable, without status migrainosus: Secondary | ICD-10-CM | POA: Insufficient documentation

## 2014-08-02 MED ORDER — ONDANSETRON 4 MG PO TBDP
8.0000 mg | ORAL_TABLET | Freq: Once | ORAL | Status: AC
  Administered 2014-08-02: 8 mg via ORAL
  Filled 2014-08-02: qty 2

## 2014-08-02 MED ORDER — OXYCODONE-ACETAMINOPHEN 5-325 MG PO TABS
1.0000 | ORAL_TABLET | Freq: Once | ORAL | Status: AC
  Administered 2014-08-02: 1 via ORAL
  Filled 2014-08-02: qty 1

## 2014-08-02 NOTE — ED Notes (Signed)
Called for pt with no answer 

## 2014-08-02 NOTE — ED Notes (Signed)
Called for pt with no answer x 3 

## 2014-08-02 NOTE — ED Notes (Signed)
Pt c/o migraine x 1 week. Pt reports nausea, denies vision changes. Pt tried ibuprofen at home without relief.

## 2014-08-02 NOTE — ED Notes (Signed)
Called for pt with no answer x 2, pt left her pager in the waiting room

## 2014-08-07 ENCOUNTER — Other Ambulatory Visit: Payer: Self-pay

## 2014-08-19 ENCOUNTER — Encounter (HOSPITAL_COMMUNITY): Payer: Self-pay | Admitting: Emergency Medicine

## 2014-08-19 ENCOUNTER — Emergency Department (HOSPITAL_COMMUNITY)
Admission: EM | Admit: 2014-08-19 | Discharge: 2014-08-20 | Disposition: A | Discharge status: Home / Self Care | Payer: Medicaid Other | Attending: Emergency Medicine | Admitting: Emergency Medicine

## 2014-08-19 DIAGNOSIS — R1013 Epigastric pain: Secondary | ICD-10-CM

## 2014-08-19 DIAGNOSIS — Z3202 Encounter for pregnancy test, result negative: Secondary | ICD-10-CM | POA: Insufficient documentation

## 2014-08-19 DIAGNOSIS — G43909 Migraine, unspecified, not intractable, without status migrainosus: Secondary | ICD-10-CM | POA: Insufficient documentation

## 2014-08-19 DIAGNOSIS — Z8719 Personal history of other diseases of the digestive system: Secondary | ICD-10-CM | POA: Insufficient documentation

## 2014-08-19 DIAGNOSIS — Z79899 Other long term (current) drug therapy: Secondary | ICD-10-CM | POA: Insufficient documentation

## 2014-08-19 DIAGNOSIS — K219 Gastro-esophageal reflux disease without esophagitis: Secondary | ICD-10-CM | POA: Insufficient documentation

## 2014-08-19 HISTORY — DX: Calculus of gallbladder without cholecystitis without obstruction: K80.20

## 2014-08-19 LAB — COMPREHENSIVE METABOLIC PANEL
ALBUMIN: 3.8 g/dL (ref 3.5–5.2)
ALT: 14 U/L (ref 0–35)
AST: 18 U/L (ref 0–37)
Alkaline Phosphatase: 90 U/L (ref 39–117)
Anion gap: 13 (ref 5–15)
BUN: 13 mg/dL (ref 6–23)
CALCIUM: 9 mg/dL (ref 8.4–10.5)
CO2: 22 mEq/L (ref 19–32)
Chloride: 104 mEq/L (ref 96–112)
Creatinine, Ser: 0.89 mg/dL (ref 0.50–1.10)
GFR calc Af Amer: >90 mL/min (ref >90)
GFR calc non Af Amer: >90 mL/min (ref >90)
GLUCOSE: 85 mg/dL (ref 70–99)
Potassium: 4 mEq/L (ref 3.7–5.3)
SODIUM: 139 meq/L (ref 137–147)
TOTAL PROTEIN: 7.1 g/dL (ref 6.0–8.3)
Total Bilirubin: <0.2 mg/dL — ABNORMAL LOW (ref 0.3–1.2)

## 2014-08-19 LAB — URINALYSIS, ROUTINE W REFLEX MICROSCOPIC
Bilirubin Urine: NEGATIVE
Glucose, UA: NEGATIVE mg/dL
Hgb urine dipstick: NEGATIVE
Ketones, ur: NEGATIVE mg/dL
LEUKOCYTES UA: NEGATIVE
Nitrite: NEGATIVE
PH: 7 (ref 5.0–8.0)
PROTEIN: NEGATIVE mg/dL
Specific Gravity, Urine: 1.028 (ref 1.005–1.030)
Urobilinogen, UA: 1 mg/dL (ref 0.0–1.0)

## 2014-08-19 LAB — CBC WITH DIFFERENTIAL/PLATELET
BASOS ABS: 0 10*3/uL (ref 0.0–0.1)
BASOS PCT: 0 % (ref 0–1)
EOS ABS: 0.2 10*3/uL (ref 0.0–0.7)
EOS PCT: 2 % (ref 0–5)
HCT: 37.7 % (ref 36.0–46.0)
Hemoglobin: 12.3 g/dL (ref 12.0–15.0)
LYMPHS ABS: 4.1 10*3/uL — AB (ref 0.7–4.0)
Lymphocytes Relative: 39 % (ref 12–46)
MCH: 28.3 pg (ref 26.0–34.0)
MCHC: 32.6 g/dL (ref 30.0–36.0)
MCV: 86.7 fL (ref 78.0–100.0)
Monocytes Absolute: 0.7 10*3/uL (ref 0.1–1.0)
Monocytes Relative: 7 % (ref 3–12)
NEUTROS PCT: 52 % (ref 43–77)
Neutro Abs: 5.4 10*3/uL (ref 1.7–7.7)
Platelets: 356 10*3/uL (ref 150–400)
RBC: 4.35 MIL/uL (ref 3.87–5.11)
RDW: 12.6 % (ref 11.5–15.5)
WBC: 10.4 10*3/uL (ref 4.0–10.5)

## 2014-08-19 LAB — PREGNANCY, URINE: PREG TEST UR: NEGATIVE

## 2014-08-19 LAB — LIPASE, BLOOD: Lipase: 22 U/L (ref 11–59)

## 2014-08-19 NOTE — ED Notes (Signed)
Vitals rechecked advised of wait time

## 2014-08-19 NOTE — ED Notes (Signed)
The pt is c/o epigastric pain for the past 3 days.  The pain radiates around to her posterior chest with    Nausea no vomiting.  Diagnosed with gallstones 1-2 years ago.  lmp friday

## 2014-08-20 MED ORDER — GI COCKTAIL ~~LOC~~
30.0000 mL | Freq: Once | ORAL | Status: AC
  Administered 2014-08-20: 30 mL via ORAL
  Filled 2014-08-20: qty 30

## 2014-08-20 MED ORDER — TRAMADOL HCL 50 MG PO TABS: 50.0000 mg | ORAL_TABLET | Freq: Four times a day (QID) | ORAL | Status: DC | PRN

## 2014-08-20 MED ORDER — HYDROCODONE-ACETAMINOPHEN 5-325 MG PO TABS
1.0000 | ORAL_TABLET | Freq: Once | ORAL | Status: AC
  Administered 2014-08-20: 1 via ORAL
  Filled 2014-08-20: qty 1

## 2014-08-20 MED ORDER — FAMOTIDINE 20 MG PO TABS
40.0000 mg | ORAL_TABLET | Freq: Once | ORAL | Status: AC
  Administered 2014-08-20: 40 mg via ORAL
  Filled 2014-08-20: qty 2

## 2014-08-20 MED ORDER — FAMOTIDINE 20 MG PO TABS: 20.0000 mg | ORAL_TABLET | Freq: Two times a day (BID) | ORAL | Status: DC

## 2014-08-20 MED ORDER — ONDANSETRON 4 MG PO TBDP
4.0000 mg | ORAL_TABLET | Freq: Once | ORAL | Status: AC
  Administered 2014-08-20: 4 mg via ORAL
  Filled 2014-08-20: qty 1

## 2014-08-20 NOTE — ED Provider Notes (Signed)
CSN: [id number]     Arrival date & time 08/19/14  2053 History   First MD Initiated Contact with Patient 08/20/14 0002     Chief Complaint  Patient presents with  . Abdominal Pain     (Consider location/radiation/quality/duration/timing/severity/associated sxs/prior Treatment) HPI  Megan [name] is a 18 y.o. female with past mental history significant for migraines and GERD complaining of epigastric abdominal pain worsening over the course of 3 days. Pain was so severe that she couldn't sleep last night, she rates it at 5 out of 10 right now, 10 out of 10 at its worse. She cannot identify any exacerbating or alleviating factors. States that pain is not exacerbated postprandially. Patient denies fever, chills, vomiting, chest pain, cough, shortness of breath, change in urination or bowel movements, decreased by mouth intake. On review of systems patient does note a mild nausea. States that she takes ibuprofen daily for headache states that she takes about 800 mg twice a day on the average.  Past Medical History  Diagnosis Date  . Migraines   . GERD (gastroesophageal reflux disease)   . Gallstones    Past Surgical History  Procedure Laterality Date  . Tumor removal      L foot   Family History  Problem Relation Age of Onset  . Heart disease Father   . Hypertension Father   . Diabetes Father   . Stroke Father   . Depression Father    History  Substance Use Topics  . Smoking status: Never Smoker   . Smokeless tobacco: Not on file  . Alcohol Use: No   OB History   Grav Para Term Preterm Abortions TAB SAB Ect Mult Living                 Review of Systems  10 systems reviewed and found to be negative, except as noted in the HPI.   Allergies  Review of patient's allergies indicates no known allergies.  Home Medications   Prior to Admission medications   Medication Sig Start Date End Date Taking? Authorizing Provider  ibuprofen (ADVIL,MOTRIN) 800 MG tablet Take 800  mg by mouth every 8 (eight) hours as needed for headache.    Yes Historical Provider, MD  omeprazole (PRILOSEC) 20 MG capsule Take 20 mg by mouth daily.   Yes Historical Provider, MD  ondansetron (ZOFRAN-ODT) 8 MG disintegrating tablet Take 8 mg by mouth every 8 (eight) hours as needed for nausea.   Yes Historical Provider, MD  propranolol (INDERAL) 10 MG tablet Take 20-30 mg by mouth 2 (two) times daily. Takes 2 tablets in am and 3 tablets in pm   Yes Historical Provider, MD  SUMAtriptan (IMITREX) 50 MG tablet Take 50 mg by mouth every 2 (two) hours as needed for migraine.    Yes Historical Provider, MD  topiramate (TOPAMAX) 25 MG tablet Take 75 mg by mouth at bedtime.    Yes Historical Provider, MD  famotidine (PEPCID) 20 MG tablet Take 1 tablet (20 mg total) by mouth 2 (two) times daily. 08/20/14   Shawnee Gambone, PA-C  traMADol (ULTRAM) 50 MG tablet Take 1 tablet (50 mg total) by mouth every 6 (six) hours as needed. 08/20/14   Arabella Revelle, PA-C   BP 125/79  Pulse 62  Temp(Src) 98.1 F (36.7 C) (Oral)  Resp 18  Ht 5\' 2"  (1.575 m)  Wt 186 lb (84.369 kg)  BMI 34.01 kg/m2  SpO2 100%  LMP 08/14/2014 Physical Exam  Nursing note and  vitals reviewed. Constitutional: She is oriented to person, place, and time. She appears well-developed and well-nourished. No distress.  HENT:  Head: Normocephalic and atraumatic.  Mouth/Throat: Oropharynx is clear and moist.  Eyes: Conjunctivae and EOM are normal. Pupils are equal, round, and reactive to light.  Neck: Normal range of motion.  Cardiovascular: Normal rate, regular rhythm and intact distal pulses.   Pulmonary/Chest: Effort normal and breath sounds normal. No stridor. No respiratory distress. She has no wheezes. She has no rales. She exhibits no tenderness.  Abdominal: Soft. Bowel sounds are normal. She exhibits no distension and no mass. There is no tenderness. There is no rebound and no guarding.  Musculoskeletal: Normal range of motion.   Neurological: She is alert and oriented to person, place, and time.  Psychiatric: She has a normal mood and affect.    ED Course  Procedures (including critical care time) Labs Review Labs Reviewed  CBC WITH DIFFERENTIAL - Abnormal; Notable for the following:    Lymphs Abs 4.1 (*)    All other components within normal limits  COMPREHENSIVE METABOLIC PANEL - Abnormal; Notable for the following:    Total Bilirubin <0.2 (*)    All other components within normal limits  LIPASE, BLOOD  URINALYSIS, ROUTINE W REFLEX MICROSCOPIC  PREGNANCY, URINE    Imaging Review No results found.   EKG Interpretation None      MDM   Final diagnoses:  Epigastric abdominal pain    Filed Vitals:   08/19/14 2109 08/19/14 2343  BP: 135/73 125/79  Pulse: 70 62  Temp: 98.2 F (36.8 C) 98.1 F (36.7 C)  TempSrc: Oral   Resp: 20 18  Height: 5\' 2"  (1.575 m)   Weight: 186 lb (84.369 kg)   SpO2: 100% 100%    Medications  famotidine (PEPCID) tablet 40 mg (not administered)  gi cocktail (Maalox,Lidocaine,Donnatal) (30 mLs Oral Given 08/20/14 0015)  HYDROcodone-acetaminophen (NORCO/VICODIN) 5-325 MG per tablet 1 tablet (1 tablet Oral Given 08/20/14 0015)  ondansetron (ZOFRAN-ODT) disintegrating tablet 4 mg (4 mg Oral Given 08/20/14 0017)    Megan [name] is a 18 y.o. female presenting with epigastric abdominal pain worsening over the course of 3 days. Abdominal exam is benign with no tenderness to palpation of any quadrant. Patient has normal vital signs, is tolerating by mouth's without issue. She has a history of gallstones however LFTs and bilirubin is normal, she is no tenderness to palpation in the right upper quadrant. I doubt this is her gallbladder. I think this may be acid reflux. She has a history of GERD, states that she takes omeprazole intermittently. She also takes Motrin regularly for headaches. Have advised her to DC Motrin, will start her on tramadol, discussed appropriate use of  acetaminophen. Patient states she does not drink alcohol. We have discussed return precautions. Patient is uninsured and has no outpatient follow-up. Patient has verbalized her understanding, seems reliable for follow-up.   Discussed case with attending MD who agrees with plan and stability to d/c to home.   Evaluation does not show pathology that would require ongoing emergent intervention or inpatient treatment. Pt is hemodynamically stable and mentating appropriately. Discussed findings and plan with patient/guardian, who agrees with care plan. All questions answered. Return precautions discussed and outpatient follow up given.   New Prescriptions   FAMOTIDINE (PEPCID) 20 MG TABLET    Take 1 tablet (20 mg total) by mouth 2 (two) times daily.   TRAMADOL (ULTRAM) 50 MG TABLET    Take 1  tablet (50 mg total) by mouth every 6 (six) hours as needed.         Wynetta Emery, PA-C 08/20/14 0109

## 2014-08-20 NOTE — Discharge Instructions (Signed)
Do not take any NSAIDs (motrin, ibuprofen, Advil, aleve , aspirin, naproxen etc.)  Take acetaminophen (Tylenol) up to 975 mg (this is normally 3 over-the-counter pills) up to 3 times a day. Do not drink alcohol. Make sure your other medications do not contain acetaminophen (Read the labels!)  Return to the emergency room for severely worsening abdominal pain, abdominal pain that localizes to a particular area (especially the right lower part of the belly), pain that persists past 8-10 hours, blood in stool or vomit, severe weakness, fainting, or fever.   Maintain hydration by drinking small amounts of clear fluids frequently, then soft diet, and then advance to a solid diet as tolerated. Avoid foods that are spicy, high in fat or dairy.  Do not hesitate to return to the emergency room for any new, worsening or concerning symptoms.  Please obtain primary care using resource guide below. But the minute you were seen in the emergency room and that they will need to obtain records for further outpatient management.   Emergency Department Resource Guide 1) Find a Doctor and Pay Out of Pocket Although you won't have to find out who is covered by your insurance plan, it is a good idea to ask around and get recommendations. You will then need to call the office and see if the doctor you have chosen will accept you as a new patient and what types of options they offer for patients who are self-pay. Some doctors offer discounts or will set up payment plans for their patients who do not have insurance, but you will need to ask so you aren't surprised when you get to your appointment.  2) Contact Your Local Health Department Not all health departments have doctors that can see patients for sick visits, but many do, so it is worth a call to see if yours does. If you don't know where your local health department is, you can check in your phone book. The CDC also has a tool to help you locate your state's health  department, and many state websites also have listings of all of their local health departments.  3) Find a Walk-in Clinic If your illness is not likely to be very severe or complicated, you may want to try a walk in clinic. These are popping up all over the country in pharmacies, drugstores, and shopping centers. They're usually staffed by nurse practitioners or physician assistants that have been trained to treat common illnesses and complaints. They're usually fairly quick and inexpensive. However, if you have serious medical issues or chronic medical problems, these are probably not your best option.  No Primary Care Doctor: - Call Health Connect at  [phone] - they can help you locate a primary care doctor that  accepts your insurance, provides certain services, etc. - Physician Referral Service- [phone]  Chronic Pain Problems: Organization         Address  Phone   Notes  [facility]  [phone] Patients need to be referred by their primary care doctor.   Medication Assistance: Organization         Address  Phone   Notes  [facility] [address] [place], [place] [phone] --Must be a resident of [facility] [place] -- Must have NO insurance coverage whatsoever (no Medicaid/ Medicare, etc.) -- The pt. MUST have a primary care doctor that directs their care regularly and follows them in the community   MedAssist  (866)  [place]  [phone]    Agencies that provide inexpensive medical care: Organization         Address  Phone   Notes  [place]  [phone]   [facility]    [phone]   [facility] [place], [place] [address] [phone]   [place] [address] [place] [phone]   Planned Parenthood    [phone]   [facility]    [phone]    [place] [address], [place] Phone:  [phone], Fax:  [phone] Hours of Operation:  9 am - 6 pm, M-F.  Also accepts Medicaid/Medicare and self-pay.  [facility] for [address], [address], [place] Phone: [phone], Fax: [phone]. Hours of Operation:  8:30 am - 5:30 pm, M-F.  Also accepts Medicaid and self-pay.  [facility] [address], [place] Phone: [phone]   [place], [place], [place] [phone], Ext. [phone] Mondays & Thursdays: 7-9 AM.  First 15 patients are seen on a first come, first serve basis.    [place] Providers:  Organization         Address  Phone   Notes  [facility] [address], [place] [phone] Also accepts self-pay patients.  [facility] [address], [place]  [phone]   [place], [address], [place] [phone]   [facility] [address], [place] [phone]   Lucianne Lei [address], [place]   [phone] Only accepts [place] Access [place] patients after they have their name applied to their card.   Self-Pay (no insurance) in [facility]:  Organization         Address  Phone   Notes  Sickle Cell Patients, [facility] [place] [phone]   [facility] [place] [phone]   [facility] [place]  [place], [place], [place] [phone]   [facility]/Dr. Osei-Bonsu  [address], [place] or [address], [place] [phone] Phone number for both [place] and [place] locations is the same.  [facility]gent [facility] and [facility] [address], [place] [phone]    [facility] [address] [place] or [address] [phone]   [facility] [address], [place] [phone], phone; [phone], fax Sees patients 1st and 3rd Saturday of every month.  Must not qualify for public or private insurance (i.e. Medicaid, Medicare, [place] Health Choice, Veterans' Benefits)  Household income should be no more than 200% of the poverty level The clinic cannot treat you if you are pregnant or think you are pregnant  Sexually transmitted diseases are not treated at the clinic.    Dental Care: Organization         Address  Phone  Notes  [facility] of [facility] [phone] Accepts children up to age 47 who are enrolled in [place] or [place]; pregnant women with  a Medicaid card; and children who have applied for Medicaid or [place] Health Choice, but were declined, whose parents can pay a reduced fee at time of service.  [facility] Department of [facility]  [address], [place] [phone] Accepts children up to age 60 who are enrolled in [place] or [place]; pregnant women with a Medicaid card; and children who have applied for Medicaid or [place] Health Choice, but were declined, whose parents can pay a reduced fee at time of service.  [place] Adult [facility] Access PROGRAM  [place] [phone] Patients are seen by appointment only. Walk-ins are not accepted. [place] will see patients 53 years of age and older. Monday - Tuesday (8am-5pm) Most Wednesdays (8:30-5pm) $30 per visit, cash only  [facility] Adult Dental Access PROGRAM  [address], [facility] [phone] Patients are seen by appointment only. Walk-ins are not accepted. [place] will see patients 30 years of age and older. One Wednesday Evening (Monthly: Volunteer Based).   $30 per visit, cash only  [place]  [phone] for adults; Children under age 78, call Graduate Pediatric Dentistry at [phone]. Children aged 42-14, please call [phone] to request a pediatric application.  Dental services are provided in all areas of dental care including fillings, crowns and bridges, complete and partial dentures, implants, gum treatment, root canals, and extractions. Preventive care is also provided. Treatment is provided to both adults and children. Patients are selected via a lottery and there is often a waiting list.   [facility] [address], [place]  [phone] [email or web address]   [facility] [address] [place], [place] [phone], Ext. [phone] Second and Fourth Thursday of each month, opens at 6:30 AM; Clinic ends at 9 AM.  Patients are seen on a first-come first-served basis, and a limited number are seen during each clinic.   [facility]  [address] Hillard Danker [place], [place] [phone]   Eligibility Requirements You must have lived in [place], [place], or [place] counties for at least the last three months.   You cannot be eligible for state or federal sponsored [employer], including [employer], [place], or [employer].   You generally cannot be eligible for healthcare insurance through your employer.    How to apply: Eligibility screenings are held every Tuesday and Wednesday afternoon from 1:00 pm until 4:00 pm. You do not need an appointment for the interview!  [facility] [address] [place], [place]   [place]  [place] Department  [place]  [phone]    Behavioral Health Resources in the Community: Intensive Outpatient Programs Organization         Address  Phone  Notes  [place]. [address], [place], [place] [phone]   [facility] [address], [place], [place]   ADS: Alcohol [facility] Drug [facility] [address], [place], [place]   [place] [address]  [place], [place] or [phone]   Substance Abuse Resources Organization         Address  Phone  Notes  Al[facility] Drug Services  [phone]   [facility]  [phone]   [facility] [place]   [facility]  [phone]   Residential & Outpatient Substance Abuse Program  [phone]   Psychological Services Organization         Address  Phone  Notes  [facility] Behavioral Health  [phone]   [facility]  [phone]   [facility] [address] [place] [phone] or [phone]    Mobile Crisis Teams Organization         Address  Phone  Notes  [facility], Mobile Crisis Care Unit  [phone]   [facility]  [address] [place], [place] [phone]   Doristine Locks [address] [place] [phone]    Self-Help/Support Groups Organization         Address  Phone             Notes  [facility] [place] - variety of support groups  336- [phone] Call for more information  Narcotics Anonymous (NA), [facility] [address], [employer] [place]  2 meetings at this location   Statistician         Address  Phone  Notes  [facility] [address],    [place]  [phone]   [facility]  [address], [place] [address], [place], [place] [phone]   [facility] [address] [place], [place] [phone] Admissions: 8am-3pm M-F  [facility] [address]    [place], [place] [phone]   [facility] [address] [place], [place], [place] [phone]   [facility] [address]  [place], [place] [phone]   [facility] - Intensive Outpatient [address]ure[address], [place], [place] [phone]   [facility] (Addiction [facility] [address] [place].,  [place], [place] [phone] or [phone]   [facility] ([facility]) [address] [place], [place] [phone] Accepts Medicaid  [facility] [place] [address]  [place] [phone] Substance Abuse/Addiction Treatment   [facility] Organization         Address  Phone  Notes  [facility]  [phone]   Angie Fava, PhD [address] Ervin [place], [place]   [phone] or [phone]   [facility]al   [address] [place], [place] [phone]   [facility] [address] [place], [place] [phone] Insurance/Medicaid/sponsorship through [facility] and Families [address]                                    [place], [place] [phone] Therapy/tele-psych/case  [facility] [address], [place] [phone]    Dr. Lolly Mustache  [phone]   [facility] [place]  [employer] [facility] Dept. 1) [address] [place] 2) [address] [place] 3)  [address], [place] [phone]  [phone]   [facility] Child Abuse Hotline 985-339-6579 or [phone] (After Hours)       Abdominal Pain, Women Abdominal (stomach, pelvic, or belly) pain can be caused by many things. It is important to tell your doctor:  The location of the pain.  Does it come and go or is it present all the time?  Are there things that start the pain (eating certain foods, exercise)?  Are there other symptoms associated with the pain (fever, nausea, vomiting, diarrhea)? All of this is helpful to know when trying to find the cause of the pain. CAUSES   Stomach: virus or bacteria infection, or  ulcer.  Intestine: appendicitis (inflamed appendix), regional ileitis (Crohn's disease), ulcerative colitis (inflamed colon), irritable bowel syndrome, diverticulitis (inflamed diverticulum of the colon), or cancer of the stomach or intestine.  Gallbladder disease or stones in the gallbladder.  Kidney disease, kidney stones, or infection.  Pancreas infection or cancer.  Fibromyalgia (pain disorder).  Diseases of the female organs:  Uterus: fibroid (non-cancerous) tumors or infection.  Fallopian tubes: infection or tubal pregnancy.  Ovary: cysts or tumors.  Pelvic adhesions (scar tissue).  Endometriosis (uterus lining tissue growing in the pelvis and on the pelvic organs).  Pelvic congestion syndrome (female organs filling up with blood just before the menstrual period).  Pain with the menstrual period.  Pain with ovulation (producing an egg).  Pain with an IUD (intrauterine device, birth control) in the uterus.  Cancer of the female organs.  Functional pain (pain not caused by a disease, may improve without treatment).  Psychological pain.  Depression. DIAGNOSIS  Your doctor will decide the seriousness of your pain by doing an examination.  Blood tests.  X-rays.  Ultrasound.  CT scan (computed tomography, special type of X-ray).  MRI (magnetic resonance imaging).  Cultures, for infection.  Barium enema (dye inserted in the large intestine, to better view it with X-rays).  Colonoscopy (looking in intestine with a lighted tube).  Laparoscopy (minor surgery, looking in abdomen with a lighted tube).  Major abdominal exploratory surgery (looking in abdomen with a large incision). TREATMENT  The treatment will depend on the cause of the pain.   Many cases can be observed and treated at home.  Over-the-counter medicines recommended by your caregiver.  Prescription medicine.  Antibiotics, for infection.  Birth control pills, for painful periods or for  ovulation pain.  Hormone treatment, for endometriosis.  Nerve blocking injections.  Physical therapy.  Antidepressants.  Counseling with a psychologist or psychiatrist.  Minor or major surgery. HOME CARE INSTRUCTIONS   Do not take laxatives, unless directed by your caregiver.  Take over-the-counter pain medicine only if ordered by your caregiver. Do not take aspirin because it can cause an upset stomach or bleeding.  Try a clear liquid diet (broth or water) as ordered by your caregiver. Slowly move to a bland diet, as tolerated, if the pain is related to the stomach or intestine.  Have a thermometer and take your temperature several times a day, and record it.  Bed rest and sleep, if it helps the pain.  Avoid sexual intercourse, if it causes pain.  Avoid stressful situations.  Keep your follow-up appointments and tests, as your caregiver orders.  If the pain does not go away with medicine or surgery, you may try:  Acupuncture.  Relaxation exercises (yoga, meditation).  Group therapy.  Counseling. SEEK MEDICAL CARE IF:   You notice certain foods cause stomach pain.  Your home care treatment is not helping your pain.  You need stronger pain medicine.  You want your IUD removed.  You feel faint or lightheaded.  You develop nausea and vomiting.  You develop a rash.  You are having side effects or an allergy to your medicine. SEEK IMMEDIATE MEDICAL CARE IF:   Your pain does not go away or gets worse.  You have a fever.  Your pain is felt only in portions  of the abdomen. The right side could possibly be appendicitis. The left lower portion of the abdomen could be colitis or diverticulitis.  You are passing blood in your stools (bright red or black tarry stools, with or without vomiting).  You have blood in your urine.  You develop chills, with or without a fever.  You pass out. MAKE SURE YOU:   Understand these instructions.  Will watch your  condition.  Will get help right away if you are not doing well or get worse. Document Released: 08/06/2007 Document Revised: 02/23/2014 Document Reviewed: 08/26/2009 [facility] Patient Information 2015 [place], [place]. This information is not intended to replace advice given to you by your health care provider. Make sure you discuss any questions you have with your health care provider.

## 2014-08-20 NOTE — ED Notes (Signed)
Pt verbalizes understanding of d/c instructions and directions for follow-up care. Pt verbalizes no additional questions or concerns at this time. 

## 2014-08-20 NOTE — ED Provider Notes (Signed)
Medical screening examination/treatment/procedure(s) were performed by non-physician practitioner and as supervising physician I was immediately available for consultation/collaboration.   EKG Interpretation None        Jael Waldorf F Lakiyah Arntson, MD 08/20/14 1443 

## 2014-09-12 ENCOUNTER — Encounter (HOSPITAL_COMMUNITY): Payer: Self-pay | Admitting: Emergency Medicine

## 2014-09-12 ENCOUNTER — Emergency Department (HOSPITAL_COMMUNITY)
Admission: EM | Admit: 2014-09-12 | Discharge: 2014-09-13 | Disposition: A | Discharge status: Home / Self Care | Payer: Medicaid Other | Attending: Emergency Medicine | Admitting: Emergency Medicine

## 2014-09-12 DIAGNOSIS — S0990XA Unspecified injury of head, initial encounter: Secondary | ICD-10-CM

## 2014-09-12 DIAGNOSIS — K219 Gastro-esophageal reflux disease without esophagitis: Secondary | ICD-10-CM | POA: Insufficient documentation

## 2014-09-12 DIAGNOSIS — S79922A Unspecified injury of left thigh, initial encounter: Secondary | ICD-10-CM | POA: Insufficient documentation

## 2014-09-12 DIAGNOSIS — W19XXXA Unspecified fall, initial encounter: Secondary | ICD-10-CM

## 2014-09-12 DIAGNOSIS — Y9289 Other specified places as the place of occurrence of the external cause: Secondary | ICD-10-CM | POA: Insufficient documentation

## 2014-09-12 DIAGNOSIS — W010XXA Fall on same level from slipping, tripping and stumbling without subsequent striking against object, initial encounter: Secondary | ICD-10-CM | POA: Insufficient documentation

## 2014-09-12 DIAGNOSIS — S79912A Unspecified injury of left hip, initial encounter: Secondary | ICD-10-CM | POA: Insufficient documentation

## 2014-09-12 DIAGNOSIS — Y9389 Activity, other specified: Secondary | ICD-10-CM | POA: Insufficient documentation

## 2014-09-12 DIAGNOSIS — Z8719 Personal history of other diseases of the digestive system: Secondary | ICD-10-CM | POA: Insufficient documentation

## 2014-09-12 DIAGNOSIS — Z79899 Other long term (current) drug therapy: Secondary | ICD-10-CM | POA: Insufficient documentation

## 2014-09-12 DIAGNOSIS — S300XXA Contusion of lower back and pelvis, initial encounter: Secondary | ICD-10-CM | POA: Insufficient documentation

## 2014-09-12 DIAGNOSIS — G43909 Migraine, unspecified, not intractable, without status migrainosus: Secondary | ICD-10-CM | POA: Insufficient documentation

## 2014-09-12 DIAGNOSIS — Y998 Other external cause status: Secondary | ICD-10-CM | POA: Insufficient documentation

## 2014-09-12 DIAGNOSIS — S20222A Contusion of left back wall of thorax, initial encounter: Secondary | ICD-10-CM

## 2014-09-12 DIAGNOSIS — Z3202 Encounter for pregnancy test, result negative: Secondary | ICD-10-CM | POA: Insufficient documentation

## 2014-09-12 NOTE — ED Notes (Signed)
Pt. slipped and fell in shower this evening , reports pain at lower back and headache .

## 2014-09-12 NOTE — ED Notes (Signed)
Pt states that she does not remember falling. Pt states that she does not remember falling, and does not remember if she slipped or if she passed out. Unknown down time.

## 2014-09-13 ENCOUNTER — Other Ambulatory Visit: Payer: Self-pay

## 2014-09-13 ENCOUNTER — Emergency Department (HOSPITAL_COMMUNITY): Payer: Medicaid Other

## 2014-09-13 LAB — BASIC METABOLIC PANEL
Anion gap: 14 (ref 5–15)
BUN: 18 mg/dL (ref 6–23)
CHLORIDE: 102 meq/L (ref 96–112)
CO2: 23 mEq/L (ref 19–32)
Calcium: 8.9 mg/dL (ref 8.4–10.5)
Creatinine, Ser: 0.83 mg/dL (ref 0.50–1.10)
GLUCOSE: 94 mg/dL (ref 70–99)
POTASSIUM: 3.8 meq/L (ref 3.7–5.3)
SODIUM: 139 meq/L (ref 137–147)

## 2014-09-13 LAB — I-STAT CHEM 8, ED
BUN: 18 mg/dL (ref 6–23)
CHLORIDE: 104 meq/L (ref 96–112)
Calcium, Ion: 1.12 mmol/L (ref 1.12–1.23)
Creatinine, Ser: 0.8 mg/dL (ref 0.50–1.10)
GLUCOSE: 94 mg/dL (ref 70–99)
HCT: 36 % (ref 36.0–46.0)
Hemoglobin: 12.2 g/dL (ref 12.0–15.0)
POTASSIUM: 3.5 meq/L — AB (ref 3.7–5.3)
SODIUM: 139 meq/L (ref 137–147)
TCO2: 23 mmol/L (ref 0–100)

## 2014-09-13 LAB — CBC
HCT: 36.7 % (ref 36.0–46.0)
HEMOGLOBIN: 12.1 g/dL (ref 12.0–15.0)
MCH: 29.2 pg (ref 26.0–34.0)
MCHC: 33 g/dL (ref 30.0–36.0)
MCV: 88.6 fL (ref 78.0–100.0)
Platelets: 326 10*3/uL (ref 150–400)
RBC: 4.14 MIL/uL (ref 3.87–5.11)
RDW: 12.8 % (ref 11.5–15.5)
WBC: 12 10*3/uL — ABNORMAL HIGH (ref 4.0–10.5)

## 2014-09-13 LAB — POC URINE PREG, ED: Preg Test, Ur: NEGATIVE

## 2014-09-13 MED ORDER — IBUPROFEN 800 MG PO TABS: 800.0000 mg | ORAL_TABLET | Freq: Three times a day (TID) | ORAL | Status: DC

## 2014-09-13 NOTE — ED Provider Notes (Signed)
CSN: [id number]     Arrival date & time 09/12/14  2252 History  This chart was scribed for Arby BarretteMarcy Sahib Pella, MD by Roxy Cedarhandni Bhalodia, ED Scribe. This patient was seen in room A06C/A06C and the patient's care was started at 12:54 AM.   Chief Complaint  Patient presents with  . Fall   Patient is a 18 y.o. female presenting with fall. The history is provided by the patient. No language interpreter was used.  Fall Associated symptoms include headaches.   HPI Comments: Megan [name] is a 18 y.o. female who presents to the Emergency Department complaining of a headache and lower back pain due to a fall she had earlier today. Patient states that she was in the shower when she slipped and fell. Per relative, patient does not remember falling. Patient can not recall LOC. Patient states she had head impact. Per relative, patient complained of headache and pain to left hip and left upper leg after getting up in the bathroom. Per relative, patient is ambulatory and walked into the ER from the car.  Past Medical History  Diagnosis Date  . Migraines   . GERD (gastroesophageal reflux disease)   . Gallstones    Past Surgical History  Procedure Laterality Date  . Tumor removal      L foot   Family History  Problem Relation Age of Onset  . Heart disease Father   . Hypertension Father   . Diabetes Father   . Stroke Father   . Depression Father    History  Substance Use Topics  . Smoking status: Never Smoker   . Smokeless tobacco: Not on file  . Alcohol Use: No   OB History    No data available     Review of Systems  Musculoskeletal: Positive for myalgias and back pain.  Neurological: Positive for headaches.   10 Systems reviewed and all are negative for acute change except as noted in the HPI.  Allergies  Review of patient's allergies indicates no known allergies.  Home Medications   Prior to Admission medications   Medication Sig Start Date End Date Taking? Authorizing Provider   famotidine (PEPCID) 20 MG tablet Take 1 tablet (20 mg total) by mouth 2 (two) times daily. 08/20/14  Yes Nicole Pisciotta, PA-C  ibuprofen (ADVIL,MOTRIN) 800 MG tablet Take 800 mg by mouth every 8 (eight) hours as needed for headache.    Yes Historical Provider, MD  ondansetron (ZOFRAN-ODT) 8 MG disintegrating tablet Take 8 mg by mouth every 8 (eight) hours as needed for nausea.   Yes Historical Provider, MD  ibuprofen (ADVIL,MOTRIN) 800 MG tablet Take 1 tablet (800 mg total) by mouth 3 (three) times daily. 09/13/14   Arby BarretteMarcy Jadeyn Hargett, MD  omeprazole (PRILOSEC) 20 MG capsule Take 20 mg by mouth daily.    Historical Provider, MD  propranolol (INDERAL) 10 MG tablet Take 20-30 mg by mouth 2 (two) times daily. Takes 2 tablets in am and 3 tablets in pm    Historical Provider, MD  SUMAtriptan (IMITREX) 50 MG tablet Take 50 mg by mouth every 2 (two) hours as needed for migraine.     Historical Provider, MD  topiramate (TOPAMAX) 25 MG tablet Take 75 mg by mouth at bedtime.     Historical Provider, MD  traMADol (ULTRAM) 50 MG tablet Take 1 tablet (50 mg total) by mouth every 6 (six) hours as needed. 08/20/14   Nicole Pisciotta, PA-C   Triage Vitals: BP 116/63 mmHg  Pulse 81  Temp(Src)  98.2 F (36.8 C)  Resp 20  SpO2 99%  LMP 09/06/2014  Physical Exam  Constitutional: She is oriented to person, place, and time. She appears well-developed and well-nourished.  HENT:  Head: Normocephalic and atraumatic.  Right Ear: External ear normal.  Left Ear: External ear normal.  Eyes: EOM are normal. Pupils are equal, round, and reactive to light.  Neck: Neck supple.  Cardiovascular: Normal rate, regular rhythm, normal heart sounds and intact distal pulses.   Pulmonary/Chest: Effort normal and breath sounds normal.  Abdominal: Soft. Bowel sounds are normal. She exhibits no distension. There is no tenderness.  Musculoskeletal: Normal range of motion. She exhibits tenderness. She exhibits no edema.  Patient has  mild tenderness to her lower back on the left over the left sciatic and left lumbar area. She does not have any bony point tenderness over the spine. By inspection palpation there are no visible hematomas or contusions.  Neurological: She is alert and oriented to person, place, and time. She has normal strength. No cranial nerve deficit. She exhibits normal muscle tone. Coordination normal. GCS eye subscore is 4. GCS verbal subscore is 5. GCS motor subscore is 6.  Skin: Skin is warm, dry and intact.  Psychiatric: She has a normal mood and affect.    ED Course  Procedures (including critical care time)  DIAGNOSTIC STUDIES: Oxygen Saturation is 99% on RA, normal by my interpretation.    COORDINATION OF CARE: 12:58 AM- Discussed plans to order diagnostic CT of head, EKG, and lab work. Will monitor patient and re evaluate. Pt advised of plan for treatment and pt agrees.  Labs Review Labs Reviewed  CBC - Abnormal; Notable for the following:    WBC 12.0 (*)    All other components within normal limits  I-STAT CHEM 8, ED - Abnormal; Notable for the following:    Potassium 3.5 (*)    All other components within normal limits  BASIC METABOLIC PANEL  POC URINE PREG, ED   Imaging Review Ct Head Wo Contrast  09/13/2014   CLINICAL DATA:  Larey SeatFell today, striking back of head. Posterior headache.  EXAM: CT HEAD WITHOUT CONTRAST  TECHNIQUE: Contiguous axial images were obtained from the base of the skull through the vertex without intravenous contrast.  COMPARISON:  None.  FINDINGS: Ventricles and sulci appear symmetrical. No mass effect or midline shift. No abnormal extra-axial fluid collections. Gray-white matter junctions are distinct. Basal cisterns are not effaced. No evidence of acute intracranial hemorrhage. No depressed skull fractures. Visualized paranasal sinuses and mastoid air cells are not opacified.  IMPRESSION: No acute intracranial abnormalities.  Normal examination.   Electronically Signed    By: Burman NievesWilliam  Stevens M.D.   On: 09/13/2014 01:52     EKG Interpretation   Date/Time:  Sunday September 13 2014 00:37:44 EST Ventricular Rate:  82 PR Interval:  157 QRS Duration: 95 QT Interval:  379 QTC Calculation: 443 R Axis:   33 Text Interpretation:  Sinus rhythm normal Confirmed by Donnald GarrePfeiffer, MD, Lebron ConnersMarcy  [phone]([id number]) on 09/13/2014 2:24:55 AM     MDM   Final diagnoses:  Fall  Minor head injury, initial encounter  Back contusion, left, initial encounter    On examination the patient does not have any external injuries. By history that she had a fall with head injury. CT does not show any abnormalities. She has normal neurologic examination. She does complain of lower back pain on the left. She is however neurologically intact and ambulatory without difficulty. Findings are  consistent with a contusion.  I personally performed the services described in this documentation, which was scribed in my presence. The recorded information has been reviewed and is accurate.  Arby Barrette, MD 09/13/14 [phone]

## 2014-09-13 NOTE — ED Notes (Signed)
Pfeiffer, MD at bedside.  

## 2014-09-13 NOTE — Discharge Instructions (Signed)
Head Injury You have received a head injury. It does not appear serious at this time. Headaches and vomiting are common following head injury. It should be easy to awaken from sleeping. Sometimes it is necessary for you to stay in the emergency department for a while for observation. Sometimes admission to the hospital may be needed. After injuries such as yours, most problems occur within the first 24 hours, but side effects may occur up to 7-10 days after the injury. It is important for you to carefully monitor your condition and contact your health care provider or seek immediate medical care if there is a change in your condition. WHAT ARE THE TYPES OF HEAD INJURIES? Head injuries can be as minor as a bump. Some head injuries can be more severe. More severe head injuries include:  A jarring injury to the brain (concussion). A bruise of the brain (contusion). This mean there is bleeding in the brain that can cause swelling. Back Contusion A contusion is a deep bruise. Contusions are the result of an injury that caused bleeding under the skin. The contusion may turn blue, purple, or yellow. Minor injuries will give you a painless contusion, but more severe contusions may stay painful and swollen for a few weeks.  CAUSES  A contusion is usually caused by a blow, trauma, or direct force to an area of the body. SYMPTOMS   Swelling and redness of the injured area.  Bruising of the injured area.  Tenderness and soreness of the injured area.  Pain. DIAGNOSIS  The diagnosis can be made by taking a history and physical exam. An X-ray, CT scan, or MRI may be needed to determine if there were any associated injuries, such as fractures. TREATMENT  Specific treatment will depend on what area of the body was injured. In general, the best treatment for a contusion is resting, icing, elevating, and applying cold compresses to the injured area. Over-the-counter medicines may also be recommended for pain  control. Ask your caregiver what the best treatment is for your contusion. HOME CARE INSTRUCTIONS   Put ice on the injured area.  Put ice in a plastic bag.  Place a towel between your skin and the bag.  Leave the ice on for 15-20 minutes, 3-4 times a day, or as directed by your health care provider.  Only take over-the-counter or prescription medicines for pain, discomfort, or fever as directed by your caregiver. Your caregiver may recommend avoiding anti-inflammatory medicines (aspirin, ibuprofen, and naproxen) for 48 hours because these medicines may increase bruising.  Rest the injured area.  If possible, elevate the injured area to reduce swelling. SEEK IMMEDIATE MEDICAL CARE IF:   You have increased bruising or swelling.  You have pain that is getting worse.  Your swelling or pain is not relieved with medicines. MAKE SURE YOU:   Understand these instructions.  Will watch your condition.  Will get help right away if you are not doing well or get worse. Document Released: 07/19/2005 Document Revised: 10/14/2013 Document Reviewed: 08/14/2011 [facility] Patient Information 2015 [place], [place]. This information is not intended to replace advice given to you by your health care provider. Make sure you discuss any questions you have with your health care provider.    A cracked skull (skull fracture).  Bleeding in the brain that collects, clots, and forms a bump (hematoma). WHAT CAUSES A HEAD INJURY? A serious head injury is most likely to happen to someone who is in a car wreck and is not  wearing a seat belt. Other causes of major head injuries include bicycle or motorcycle accidents, sports injuries, and falls. HOW ARE HEAD INJURIES DIAGNOSED? A complete history of the event leading to the injury and your current symptoms will be helpful in diagnosing head injuries. Many times, pictures of the brain, such as CT or MRI are needed to see the extent of the injury. Often, an  overnight hospital stay is necessary for observation.  WHEN SHOULD I SEEK IMMEDIATE MEDICAL CARE?  You should get help right away if:  You have confusion or drowsiness.  You feel sick to your stomach (nauseous) or have continued, forceful vomiting.  You have dizziness or unsteadiness that is getting worse.  You have severe, continued headaches not relieved by medicine. Only take over-the-counter or prescription medicines for pain, fever, or discomfort as directed by your health care provider.  You do not have normal function of the arms or legs or are unable to walk.  You notice changes in the black spots in the center of the colored part of your eye (pupil).  You have a clear or bloody fluid coming from your nose or ears.  You have a loss of vision. During the next 24 hours after the injury, you must stay with someone who can watch you for the warning signs. This person should contact local emergency services (911 in the [place] if you have seizures, you become unconscious, or you are unable to wake up. HOW CAN I PREVENT A HEAD INJURY IN THE FUTURE? The most important factor for preventing major head injuries is avoiding motor vehicle accidents. To minimize the potential for damage to your head, it is crucial to wear seat belts while riding in motor vehicles. Wearing helmets while bike riding and playing collision sports (like football) is also helpful. Also, avoiding dangerous activities around the house will further help reduce your risk of head injury.  WHEN CAN I RETURN TO NORMAL ACTIVITIES AND ATHLETICS? You should be reevaluated by your health care provider before returning to these activities. If you have any of the following symptoms, you should not return to activities or contact sports until 1 week after the symptoms have stopped:  Persistent headache.  Dizziness or vertigo.  Poor attention and concentration.  Confusion.  Memory problems.  Nausea or vomiting.  Fatigue  or tire easily.  Irritability.  Intolerant of bright lights or loud noises.  Anxiety or depression.  Disturbed sleep. MAKE SURE YOU:   Understand these instructions.  Will watch your condition.  Will get help right away if you are not doing well or get worse. Document Released: 10/09/2005 Document Revised: 10/14/2013 Document Reviewed: 06/16/2013 [facility] Patient Information 2015 [place], [place]. This information is not intended to replace advice given to you by your health care provider. Make sure you discuss any questions you have with your health care provider.

## 2014-09-14 ENCOUNTER — Emergency Department (HOSPITAL_COMMUNITY)
Admission: EM | Admit: 2014-09-14 | Discharge: 2014-09-14 | Disposition: A | Discharge status: Home / Self Care | Payer: Medicaid Other | Attending: Emergency Medicine | Admitting: Emergency Medicine

## 2014-09-14 ENCOUNTER — Encounter (HOSPITAL_COMMUNITY): Payer: Self-pay | Admitting: Family Medicine

## 2014-09-14 DIAGNOSIS — Z79899 Other long term (current) drug therapy: Secondary | ICD-10-CM | POA: Insufficient documentation

## 2014-09-14 DIAGNOSIS — R4189 Other symptoms and signs involving cognitive functions and awareness: Secondary | ICD-10-CM

## 2014-09-14 DIAGNOSIS — R4182 Altered mental status, unspecified: Secondary | ICD-10-CM | POA: Insufficient documentation

## 2014-09-14 DIAGNOSIS — Z791 Long term (current) use of non-steroidal anti-inflammatories (NSAID): Secondary | ICD-10-CM | POA: Insufficient documentation

## 2014-09-14 DIAGNOSIS — K219 Gastro-esophageal reflux disease without esophagitis: Secondary | ICD-10-CM | POA: Insufficient documentation

## 2014-09-14 DIAGNOSIS — G43909 Migraine, unspecified, not intractable, without status migrainosus: Secondary | ICD-10-CM | POA: Insufficient documentation

## 2014-09-14 DIAGNOSIS — R55 Syncope and collapse: Secondary | ICD-10-CM | POA: Insufficient documentation

## 2014-09-14 NOTE — ED Provider Notes (Signed)
CSN: [id number]     Arrival date & time 09/14/14  1418 History   First MD Initiated Contact with Patient 09/14/14 1518     Chief Complaint  Patient presents with  . Loss of Consciousness     (Consider location/radiation/quality/duration/timing/severity/associated sxs/prior Treatment) Patient is a 18 y.o. female presenting with syncope. The history is provided by the patient.  Loss of Consciousness Associated symptoms: no chest pain, no confusion, no fever, no headaches, no nausea, no palpitations, no shortness of breath, no vomiting and no weakness   pt with hx migraines, c/o falling and hitting head in shower 2 days ago. States slipped and fell back. No loc, was dazed. Was seen in ED then and had labs and CT.  States last night when about to have dinner 'blanked out' for a couple seconds, was walking, carrying her food, and felt disoriented and lightheaded, had diffusely blurry vision, lasted for a few seconds. Did not fall to floor or drop anything. No associated headache, cp, palpitations or other symptoms. Today at work, felt fine, and then later felt had trouble remembering things, and again had episode/'spell' where transiently felt dizzy/faint, blurry vision, confused for a couple seconds. Currently no visual symptoms, states normal. No changes in speech. No problems w walking or normal functional ability. No nv. No severe headaches. No numbness/weakness. No hx dysrhythmia or fainting episodes. States just finished normal period a few days ago. No recent abn blood loss or heavy vaginal bleeding. No cough or uri c/o. No fever or chills. No change in meds or new meds. No dysuria or gu c/o. No fever or chills.      Past Medical History  Diagnosis Date  . Migraines   . GERD (gastroesophageal reflux disease)   . Gallstones    Past Surgical History  Procedure Laterality Date  . Tumor removal      L foot   Family History  Problem Relation Age of Onset  . Heart disease Father   .  Hypertension Father   . Diabetes Father   . Stroke Father   . Depression Father    History  Substance Use Topics  . Smoking status: Never Smoker   . Smokeless tobacco: Not on file  . Alcohol Use: No   OB History    No data available     Review of Systems  Constitutional: Negative for fever and chills.  HENT: Negative for sore throat.   Eyes: Negative for pain and visual disturbance.  Respiratory: Negative for cough and shortness of breath.   Cardiovascular: Positive for syncope. Negative for chest pain, palpitations and leg swelling.  Gastrointestinal: Negative for nausea, vomiting, abdominal pain, diarrhea and blood in stool.  Genitourinary: Negative for dysuria, flank pain, vaginal bleeding and vaginal discharge.  Musculoskeletal: Negative for back pain and neck pain.  Skin: Negative for rash.  Neurological: Negative for weakness, numbness and headaches.  Hematological: Does not bruise/bleed easily.  Psychiatric/Behavioral: Negative for confusion.      Allergies  Review of patient's allergies indicates no known allergies.  Home Medications   Prior to Admission medications   Medication Sig Start Date End Date Taking? Authorizing Provider  famotidine (PEPCID) 20 MG tablet Take 1 tablet (20 mg total) by mouth 2 (two) times daily. 08/20/14   Nicole Pisciotta, PA-C  ibuprofen (ADVIL,MOTRIN) 800 MG tablet Take 800 mg by mouth every 8 (eight) hours as needed for headache.     Historical Provider, MD  ibuprofen (ADVIL,MOTRIN) 800 MG tablet Take 1  tablet (800 mg total) by mouth 3 (three) times daily. 09/13/14   Arby BarretteMarcy Pfeiffer, MD  omeprazole (PRILOSEC) 20 MG capsule Take 20 mg by mouth daily.    Historical Provider, MD  ondansetron (ZOFRAN-ODT) 8 MG disintegrating tablet Take 8 mg by mouth every 8 (eight) hours as needed for nausea.    Historical Provider, MD  propranolol (INDERAL) 10 MG tablet Take 20-30 mg by mouth 2 (two) times daily. Takes 2 tablets in am and 3 tablets in pm     Historical Provider, MD  SUMAtriptan (IMITREX) 50 MG tablet Take 50 mg by mouth every 2 (two) hours as needed for migraine.     Historical Provider, MD  topiramate (TOPAMAX) 25 MG tablet Take 75 mg by mouth at bedtime.     Historical Provider, MD  traMADol (ULTRAM) 50 MG tablet Take 1 tablet (50 mg total) by mouth every 6 (six) hours as needed. 08/20/14   Nicole Pisciotta, PA-C   BP 127/71 mmHg  Pulse 87  Temp(Src) 98.3 F (36.8 C)  Resp 18  SpO2 99%  LMP 09/06/2014 Physical Exam  Constitutional: She is oriented to person, place, and time. She appears well-developed and well-nourished. No distress.  HENT:  Head: Atraumatic.  Mouth/Throat: Oropharynx is clear and moist.  tms normal  Eyes: Conjunctivae and EOM are normal. Pupils are equal, round, and reactive to light. No scleral icterus.  Neck: Normal range of motion. Neck supple. No tracheal deviation present.  No bruit  Cardiovascular: Normal rate, regular rhythm, normal heart sounds and intact distal pulses.  Exam reveals no gallop and no friction rub.   No murmur heard. Pulmonary/Chest: Effort normal and breath sounds normal. No respiratory distress.  Abdominal: Soft. Normal appearance and bowel sounds are normal. She exhibits no distension and no mass. There is no tenderness. There is no rebound and no guarding.  Genitourinary:  No cva tenderness  Musculoskeletal: Normal range of motion. She exhibits no edema or tenderness.  CTLS spine, non tender, aligned, no step off.   Neurological: She is alert and oriented to person, place, and time. No cranial nerve deficit.  Motor intact bil, stre 5/5, sens intact. No pronator drift. Finger to nose normal. Steady gait.   Skin: Skin is warm and dry. No rash noted. She is not diaphoretic.  Psychiatric: She has a normal mood and affect.  Nursing note and vitals reviewed.   ED Course  Procedures (including critical care time)   Results for orders placed or performed during the  hospital encounter of 09/12/14  CBC  (at [facility])  Result Value Ref Range   WBC 12.0 (H) 4.0 - 10.5 K/uL   RBC 4.14 3.87 - 5.11 MIL/uL   Hemoglobin 12.1 12.0 - 15.0 g/dL   HCT 40.936.7 81.136.0 - 91.446.0 %   MCV 88.6 78.0 - 100.0 fL   MCH 29.2 26.0 - 34.0 pg   MCHC 33.0 30.0 - 36.0 g/dL   RDW 78.212.8 95.611.5 - 21.315.5 %   Platelets 326 150 - 400 K/uL  Basic metabolic panel  (at [facility])  Result Value Ref Range   Sodium 139 137 - 147 mEq/L   Potassium 3.8 3.7 - 5.3 mEq/L   Chloride 102 96 - 112 mEq/L   CO2 23 19 - 32 mEq/L   Glucose, Bld 94 70 - 99 mg/dL   BUN 18 6 - 23 mg/dL   Creatinine, Ser 0.860.83 0.50 - 1.10 mg/dL   Calcium 8.9 8.4 -  10.5 mg/dL   GFR calc non Af Amer >90 >90 mL/min   GFR calc Af Amer >90 >90 mL/min   Anion gap 14 5 - 15  I-Stat Chem 8, ED  Result Value Ref Range   Sodium 139 137 - 147 mEq/L   Potassium 3.5 (L) 3.7 - 5.3 mEq/L   Chloride 104 96 - 112 mEq/L   BUN 18 6 - 23 mg/dL   Creatinine, Ser 3.080.80 0.50 - 1.10 mg/dL   Glucose, Bld 94 70 - 99 mg/dL   Calcium, Ion 6.571.12 8.461.12 - 1.23 mmol/L   TCO2 23 0 - 100 mmol/L   Hemoglobin 12.2 12.0 - 15.0 g/dL   HCT 96.236.0 95.236.0 - 84.146.0 %  POC Urine Preg, ED (if patient is pre-menopausal female) [facility]  Result Value Ref Range   Preg Test, Ur NEGATIVE NEGATIVE   Ct Head Wo Contrast  09/13/2014   CLINICAL DATA:  Larey SeatFell today, striking back of head. Posterior headache.  EXAM: CT HEAD WITHOUT CONTRAST  TECHNIQUE: Contiguous axial images were obtained from the base of the skull through the vertex without intravenous contrast.  COMPARISON:  None.  FINDINGS: Ventricles and sulci appear symmetrical. No mass effect or midline shift. No abnormal extra-axial fluid collections. Gray-white matter junctions are distinct. Basal cisterns are not effaced. No evidence of acute intracranial hemorrhage. No depressed skull fractures. Visualized paranasal sinuses and mastoid air cells are not opacified.  IMPRESSION: No acute intracranial  abnormalities.  Normal examination.   Electronically Signed   By: Burman NievesWilliam  Stevens M.D.   On: 09/13/2014 01:52        Date: 09/14/2014  Rate: 82  Rhythm: normal sinus rhythm  QRS Axis: normal  Intervals: normal  ST/T Wave abnormalities: normal  Conduction Disutrbances:none  Narrative Interpretation:   Old EKG Reviewed: none available    MDM     Monitor. Ecg.  Reviewed nursing notes and prior charts for additional history.   Reviewed recent head ct neg, labs unremarkable, u preg neg.   Discussed w pt, possible that symptoms related to post concussion syndrome.   Vitals normal.  Pt currently asymptomatic.  Given recent 'spells'/episodes, rec close pcp f/u.  Also will refer to card for eval for possible holter monitor.  If above workup neg, and persistent symptoms, neuro f/u.  Will provide referrals.   As asympt, normal vitals, recent neg workup, pt appears stable for d/c.      Suzi RootsKevin E Abb Gobert, MD 09/14/14 [id number]

## 2014-09-14 NOTE — Discharge Instructions (Signed)
It was our pleasure to provide your ER care today - we hope that you feel better.  Rest. Drink plenty of fluids. Take tylenol as need.  For 'blanking out' episodes, follow up with cardiologist in coming week - discuss possible outpatient/Holter monitor - see referral - call office for appoiontment.  If recurrent neurologic symptoms, follow up with neurologist in the next 1-2 weeks - see referral - call office to arrange appointment.   Your primary care doctor would be a good resource to follow up with as well, and can help coordinate specialty care follow up as needed.   Return to ER if worse, fainting/passing out, chest pain, trouble breathing, fevers, change in speech or vision, other concern.       Post-Concussion Syndrome Post-concussion syndrome describes the symptoms that can occur after a head injury. These symptoms can last from weeks to months. CAUSES  It is not clear why some head injuries cause post-concussion syndrome. It can occur whether your head injury was mild or severe and whether you were wearing head protection or not.  SIGNS AND SYMPTOMS  Memory difficulties.  Dizziness.  Headaches.  Double vision or blurry vision.  Sensitivity to light.  Hearing difficulties.  Depression.  Tiredness.  Weakness.  Difficulty with concentration.  Difficulty sleeping or staying asleep.  Vomiting.  Poor balance or instability on your feet.  Slow reaction time.  Difficulty learning and remembering things you have heard. DIAGNOSIS  There is no test to determine whether you have post-concussion syndrome. Your health care provider may order an imaging scan of your brain, such as a CT scan, to check for other problems that may be causing your symptoms (such as severe injury inside your skull). TREATMENT  Usually, these problems disappear over time without medical care. Your health care provider may prescribe medicine to help ease your symptoms. It is important to  follow up with a neurologist to evaluate your recovery and address any lingering symptoms or issues. HOME CARE INSTRUCTIONS   Only take over-the-counter or prescription medicines for pain, discomfort, or fever as directed by your health care provider. Do not take aspirin. Aspirin can slow blood clotting.  Sleep with your head slightly elevated to help with headaches.  Avoid any situation where there is potential for another head injury (football, hockey, soccer, basketball, martial arts, downhill snow sports, and horseback riding). Your condition will get worse every time you experience a concussion. You should avoid these activities until you are evaluated by the appropriate follow-up health care providers.  Keep all follow-up appointments as directed by your health care provider. SEEK IMMEDIATE MEDICAL CARE IF:  You develop confusion or unusual drowsiness.  You cannot wake the injured person.  You develop nausea or persistent, forceful vomiting.  You feel like you are moving when you are not (vertigo).  You notice the injured person's eyes moving rapidly back and forth. This may be a sign of vertigo.  You have convulsions or faint.  You have severe, persistent headaches that are not relieved by medicine.  You cannot use your arms or legs normally.  Your pupils change size.  You have clear or bloody discharge from the nose or ears.  Your problems are getting worse, not better. MAKE SURE YOU:  Understand these instructions.  Will watch your condition.  Will get help right away if you are not doing well or get worse. Document Released: 03/31/2002 Document Revised: 07/30/2013 Document Reviewed: 01/14/2014 [facility] Patient Information 2015 [place], [place]. This information is  not intended to replace advice given to you by your health care provider. Make sure you discuss any questions you have with your health care provider.     Near-Syncope Near-syncope (commonly known as  near fainting) is sudden weakness, dizziness, or feeling like you might pass out. During an episode of near-syncope, you may also develop pale skin, have tunnel vision, or feel sick to your stomach (nauseous). Near-syncope may occur when getting up after sitting or while standing for a long time. It is caused by a sudden decrease in blood flow to the brain. This decrease can result from various causes or triggers, most of which are not serious. However, because near-syncope can sometimes be a sign of something serious, a medical evaluation is required. The specific cause is often not determined. HOME CARE INSTRUCTIONS  Monitor your condition for any changes. The following actions may help to alleviate any discomfort you are experiencing:  Have someone stay with you until you feel stable.  Lie down right away and prop your feet up if you start feeling like you might faint. Breathe deeply and steadily. Wait until all the symptoms have passed. Most of these episodes last only a few minutes. You may feel tired for several hours.   Drink enough fluids to keep your urine clear or pale yellow.   If you are taking blood pressure or heart medicine, get up slowly when seated or lying down. Take several minutes to sit and then stand. This can reduce dizziness.  Follow up with your health care provider as directed. SEEK IMMEDIATE MEDICAL CARE IF:   You have a severe headache.   You have unusual pain in the chest, abdomen, or back.   You are bleeding from the mouth or rectum, or you have black or tarry stool.   You have an irregular or very fast heartbeat.   You have repeated fainting or have seizure-like jerking during an episode.   You faint when sitting or lying down.   You have confusion.   You have difficulty walking.   You have severe weakness.   You have vision problems.  MAKE SURE YOU:   Understand these instructions.  Will watch your condition.  Will get help right  away if you are not doing well or get worse. Document Released: 10/09/2005 Document Revised: 10/14/2013 Document Reviewed: 03/14/2013 [facility] Patient Information 2015 [place], [place]. This information is not intended to replace advice given to you by your health care provider. Make sure you discuss any questions you have with your health care provider.

## 2014-09-14 NOTE — ED Notes (Signed)
Per pt sts she fell in the shower Saturday and came here. sts did CT scan and was normal. sts HA blurry vision.

## 2015-01-06 ENCOUNTER — Encounter (HOSPITAL_COMMUNITY): Payer: Self-pay | Admitting: *Deleted

## 2015-01-06 ENCOUNTER — Emergency Department (HOSPITAL_COMMUNITY)
Admission: EM | Admit: 2015-01-06 | Discharge: 2015-01-07 | Disposition: A | Discharge status: Home / Self Care | Payer: Medicaid Other | Attending: Emergency Medicine | Admitting: Emergency Medicine

## 2015-01-06 DIAGNOSIS — S39012A Strain of muscle, fascia and tendon of lower back, initial encounter: Secondary | ICD-10-CM

## 2015-01-06 DIAGNOSIS — K219 Gastro-esophageal reflux disease without esophagitis: Secondary | ICD-10-CM | POA: Insufficient documentation

## 2015-01-06 DIAGNOSIS — X58XXXA Exposure to other specified factors, initial encounter: Secondary | ICD-10-CM | POA: Insufficient documentation

## 2015-01-06 DIAGNOSIS — Z791 Long term (current) use of non-steroidal anti-inflammatories (NSAID): Secondary | ICD-10-CM | POA: Insufficient documentation

## 2015-01-06 DIAGNOSIS — Y990 Civilian activity done for income or pay: Secondary | ICD-10-CM | POA: Insufficient documentation

## 2015-01-06 DIAGNOSIS — Z8679 Personal history of other diseases of the circulatory system: Secondary | ICD-10-CM | POA: Insufficient documentation

## 2015-01-06 DIAGNOSIS — Y929 Unspecified place or not applicable: Secondary | ICD-10-CM | POA: Insufficient documentation

## 2015-01-06 DIAGNOSIS — Y9389 Activity, other specified: Secondary | ICD-10-CM | POA: Insufficient documentation

## 2015-01-06 DIAGNOSIS — Z79899 Other long term (current) drug therapy: Secondary | ICD-10-CM | POA: Insufficient documentation

## 2015-01-06 LAB — URINALYSIS, ROUTINE W REFLEX MICROSCOPIC
BILIRUBIN URINE: NEGATIVE
Glucose, UA: NEGATIVE mg/dL
Ketones, ur: NEGATIVE mg/dL
LEUKOCYTES UA: NEGATIVE
Nitrite: NEGATIVE
Protein, ur: NEGATIVE mg/dL
SPECIFIC GRAVITY, URINE: 1.026 (ref 1.005–1.030)
UROBILINOGEN UA: 1 mg/dL (ref 0.0–1.0)
pH: 6.5 (ref 5.0–8.0)

## 2015-01-06 LAB — URINE MICROSCOPIC-ADD ON

## 2015-01-06 MED ORDER — CYCLOBENZAPRINE HCL 10 MG PO TABS
10.0000 mg | ORAL_TABLET | Freq: Once | ORAL | Status: AC
  Administered 2015-01-06: 10 mg via ORAL
  Filled 2015-01-06: qty 1

## 2015-01-06 MED ORDER — HYDROCODONE-ACETAMINOPHEN 5-325 MG PO TABS
2.0000 | ORAL_TABLET | Freq: Once | ORAL | Status: AC
  Administered 2015-01-06: 2 via ORAL
  Filled 2015-01-06: qty 2

## 2015-01-06 NOTE — ED Notes (Signed)
Pt reports lower back pain starting this morning when she woke up. Pt states pain increases as the day goes on. 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

## 2015-01-06 NOTE — ED Provider Notes (Signed)
CSN: [id number]     Arrival date & time 01/06/15  2111 History   First MD Initiated Contact with Patient 01/06/15 2307     Chief Complaint  Patient presents with  . Back Pain     (Consider location/radiation/quality/duration/timing/severity/associated sxs/prior Treatment) Patient is a 19 y.o. female presenting with back pain. The history is provided by the patient. No language interpreter was used.  Back Pain Location:  Lumbar spine Quality:  Aching Radiates to:  Does not radiate Pain severity:  Moderate Pain is:  Same all the time Onset quality:  Gradual Duration:  1 day Timing:  Constant Progression:  Unchanged Chronicity:  New Context: lifting heavy objects   Context: not emotional stress, not falling, not jumping from heights, not MCA, not MVA, not occupational injury, not pedestrian accident, not physical stress, not recent illness, not recent injury and not twisting   Relieved by:  Nothing Worsened by:  Movement Ineffective treatments:  Bed rest Associated symptoms: no abdominal pain, no chest pain, no dysuria, no fever and no weakness   Risk factors: no hx of cancer, no hx of osteoporosis, no lack of exercise, no menopause, not obese, not pregnant, no recent surgery and no steroid use     Past Medical History  Diagnosis Date  . Migraines   . GERD (gastroesophageal reflux disease)   . Gallstones    Past Surgical History  Procedure Laterality Date  . Tumor removal      L foot   Family History  Problem Relation Age of Onset  . Heart disease Father   . Hypertension Father   . Diabetes Father   . Stroke Father   . Depression Father    History  Substance Use Topics  . Smoking status: Never Smoker   . Smokeless tobacco: Not on file  . Alcohol Use: No   OB History    No data available     Review of Systems  Constitutional: Negative for fever, chills and fatigue.  HENT: Negative for trouble swallowing.   Eyes: Negative for visual disturbance.   Respiratory: Negative for shortness of breath.   Cardiovascular: Negative for chest pain and palpitations.  Gastrointestinal: Negative for nausea, vomiting, abdominal pain and diarrhea.  Genitourinary: Negative for dysuria and difficulty urinating.  Musculoskeletal: Positive for back pain. Negative for arthralgias and neck pain.  Skin: Negative for color change.  Neurological: Negative for dizziness and weakness.  Psychiatric/Behavioral: Negative for dysphoric mood.      Allergies  Review of patient's allergies indicates no known allergies.  Home Medications   Prior to Admission medications   Medication Sig Start Date End Date Taking? Authorizing Provider  famotidine (PEPCID) 20 MG tablet Take 1 tablet (20 mg total) by mouth 2 (two) times daily. 08/20/14   Nicole Pisciotta, PA-C  ibuprofen (ADVIL,MOTRIN) 800 MG tablet Take 800 mg by mouth every 8 (eight) hours as needed for headache.     Historical Provider, MD  ibuprofen (ADVIL,MOTRIN) 800 MG tablet Take 1 tablet (800 mg total) by mouth 3 (three) times daily. 09/13/14   Arby Barrette, MD  omeprazole (PRILOSEC) 20 MG capsule Take 20 mg by mouth daily.    Historical Provider, MD  ondansetron (ZOFRAN-ODT) 8 MG disintegrating tablet Take 8 mg by mouth every 8 (eight) hours as needed for nausea.    Historical Provider, MD  propranolol (INDERAL) 10 MG tablet Take 20-30 mg by mouth 2 (two) times daily. Takes 2 tablets in am and 3 tablets in pm  Historical Provider, MD  topiramate (TOPAMAX) 25 MG tablet Take 75 mg by mouth at bedtime.     Historical Provider, MD  traMADol (ULTRAM) 50 MG tablet Take 1 tablet (50 mg total) by mouth every 6 (six) hours as needed. 08/20/14   Nicole Pisciotta, PA-C   BP 127/83 mmHg  Pulse 74  Temp(Src) 98.6 F (37 C)  Resp 18  SpO2 100%  LMP 12/12/2014 (Approximate) Physical Exam  Constitutional: She is oriented to person, place, and time. She appears well-developed and well-nourished. No distress.   HENT:  Head: Normocephalic and atraumatic.  Eyes: Conjunctivae and EOM are normal.  Neck: Normal range of motion.  Cardiovascular: Normal rate and regular rhythm.  Exam reveals no gallop and no friction rub.   No murmur heard. Pulmonary/Chest: Effort normal and breath sounds normal. She has no wheezes. She has no rales. She exhibits no tenderness.  Abdominal: Soft. There is no tenderness.  Musculoskeletal: Normal range of motion.  No midline spine tenderness to palpation. Left lumbar paraspinal tenderness to palpation.   Neurological: She is alert and oriented to person, place, and time. Coordination normal.  Lower extremity strength and sensation equal and intact bilaterally. Speech is goal-oriented. Moves limbs without ataxia.   Skin: Skin is warm and dry.  Psychiatric: She has a normal mood and affect. Her behavior is normal.  Nursing note and vitals reviewed.   ED Course  Procedures (including critical care time) Labs Review Labs Reviewed  URINALYSIS, ROUTINE W REFLEX MICROSCOPIC - Abnormal; Notable for the following:    Hgb urine dipstick TRACE (*)    All other components within normal limits  URINE MICROSCOPIC-ADD ON - Abnormal; Notable for the following:    Squamous Epithelial / LPF FEW (*)    Bacteria, UA FEW (*)    All other components within normal limits  POC URINE PREG, ED    Imaging Review No results found.   EKG Interpretation None      MDM   Final diagnoses:  Lumbar strain, initial encounter    11:08 PM Urinalysis pending. Vitals stable and patient afebrile. Patient likely has muscle strain. Patient will have Vicodin and Flexeril.   12:17 AM Urinalysis unremarkable for acute changes. Patient likely has a muscle strain and will be discharged with symptomatic treatment.     Emilia BeckKaitlyn Ziyonna Christner, PA-C 01/07/15 0017  Azalia BilisKevin Campos, MD 01/07/15 [phone]

## 2015-01-06 NOTE — ED Notes (Signed)
Pt informed of need for urine sample.

## 2015-01-07 MED ORDER — HYDROCODONE-ACETAMINOPHEN 5-325 MG PO TABS: 2.0000 | ORAL_TABLET | ORAL | Status: DC | PRN

## 2015-01-07 MED ORDER — CYCLOBENZAPRINE HCL 10 MG PO TABS: 10.0000 mg | ORAL_TABLET | Freq: Two times a day (BID) | ORAL | Status: DC | PRN

## 2015-01-07 NOTE — Discharge Instructions (Signed)
Take Vicodin as needed for pain. Take Flexeril for muscle spasm. You may take these medications together. Refer to attached documents for more information. Return to the ED with worsening or concerning symptoms.

## 2015-02-04 ENCOUNTER — Emergency Department (HOSPITAL_COMMUNITY)
Admission: EM | Admit: 2015-02-04 | Discharge: 2015-02-05 | Disposition: A | Discharge status: Home / Self Care | Payer: Medicaid Other | Attending: Emergency Medicine | Admitting: Emergency Medicine

## 2015-02-04 DIAGNOSIS — J020 Streptococcal pharyngitis: Secondary | ICD-10-CM

## 2015-02-04 DIAGNOSIS — Z791 Long term (current) use of non-steroidal anti-inflammatories (NSAID): Secondary | ICD-10-CM | POA: Insufficient documentation

## 2015-02-04 DIAGNOSIS — Z79899 Other long term (current) drug therapy: Secondary | ICD-10-CM | POA: Insufficient documentation

## 2015-02-04 DIAGNOSIS — G43909 Migraine, unspecified, not intractable, without status migrainosus: Secondary | ICD-10-CM | POA: Insufficient documentation

## 2015-02-04 DIAGNOSIS — K219 Gastro-esophageal reflux disease without esophagitis: Secondary | ICD-10-CM | POA: Insufficient documentation

## 2015-02-04 NOTE — ED Notes (Signed)
Pt c/o sore throat and headache. Pt's niece was recently diagnosed with streph throat.

## 2015-02-05 ENCOUNTER — Encounter (HOSPITAL_COMMUNITY): Payer: Self-pay | Admitting: *Deleted

## 2015-02-05 LAB — RAPID STREP SCREEN (MED CTR MEBANE ONLY): STREPTOCOCCUS, GROUP A SCREEN (DIRECT): POSITIVE — AB

## 2015-02-05 MED ORDER — PENICILLIN G BENZATHINE 1200000 UNIT/2ML IM SUSP
1.2000 10*6.[IU] | Freq: Once | INTRAMUSCULAR | Status: AC
  Administered 2015-02-05: 1.2 10*6.[IU] via INTRAMUSCULAR
  Filled 2015-02-05: qty 2

## 2015-02-05 NOTE — Discharge Instructions (Signed)
Tylenol or Motrin for pain. Salt water gargles several times a day. Patient should drink plenty of fluids. Rest. It may take a few days for yourself throat to go away completely. Return or follow up if your symptoms are worsening.  Strep Throat Strep throat is an infection of the throat caused by a bacteria named Streptococcus pyogenes. Your health care provider may call the infection streptococcal "tonsillitis" or "pharyngitis" depending on whether there are signs of inflammation in the tonsils or back of the throat. Strep throat is most common in children aged 5-15 years during the cold months of the year, but it can occur in people of any age during any season. This infection is spread from person to person (contagious) through coughing, sneezing, or other close contact. SIGNS AND SYMPTOMS   Fever or chills.  Painful, swollen, red tonsils or throat.  Pain or difficulty when swallowing.  White or yellow spots on the tonsils or throat.  Swollen, tender lymph nodes or "glands" of the neck or under the jaw.  Red rash all over the body (rare). DIAGNOSIS  Many different infections can cause the same symptoms. A test must be done to confirm the diagnosis so the right treatment can be given. A "rapid strep test" can help your health care provider make the diagnosis in a few minutes. If this test is not available, a light swab of the infected area can be used for a throat culture test. If a throat culture test is done, results are usually available in a day or two. TREATMENT  Strep throat is treated with antibiotic medicine. HOME CARE INSTRUCTIONS   Gargle with 1 tsp of salt in 1 cup of warm water, 3-4 times per day or as needed for comfort.  Family members who also have a sore throat or fever should be tested for strep throat and treated with antibiotics if they have the strep infection.  Make sure everyone in your household washes their hands well.  Do not share food, drinking cups, or  personal items that could cause the infection to spread to others.  You may need to eat a soft food diet until your sore throat gets better.  Drink enough water and fluids to keep your urine clear or pale yellow. This will help prevent dehydration.  Get plenty of rest.  Stay home from school, day care, or work until you have been on antibiotics for 24 hours.  Take medicines only as directed by your health care provider.  Take your antibiotic medicine as directed by your health care provider. Finish it even if you start to feel better. SEEK MEDICAL CARE IF:   The glands in your neck continue to enlarge.  You develop a rash, cough, or earache.  You cough up green, yellow-brown, or bloody sputum.  You have pain or discomfort not controlled by medicines.  Your problems seem to be getting worse rather than better.  You have a fever. SEEK IMMEDIATE MEDICAL CARE IF:   You develop any new symptoms such as vomiting, severe headache, stiff or painful neck, chest pain, shortness of breath, or trouble swallowing.  You develop severe throat pain, drooling, or changes in your voice.  You develop swelling of the neck, or the skin on the neck becomes red and tender.  You develop signs of dehydration, such as fatigue, dry mouth, and decreased urination.  You become increasingly sleepy, or you cannot wake up completely. MAKE SURE YOU:  Understand these instructions.  Will watch your condition.  Will get help right away if you are not doing well or get worse. Document Released: 10/06/2000 Document Revised: 02/23/2014 Document Reviewed: 12/08/2010 [facility] Patient Information 2015 [place], [place]. This information is not intended to replace advice given to you by your health care provider. Make sure you discuss any questions you have with your health care provider.

## 2015-02-05 NOTE — ED Provider Notes (Signed)
CSN: [id number]     Arrival date & time 02/04/15  2334 History   First MD Initiated Contact with Patient 02/05/15 0001     Chief Complaint  Patient presents with  . Sore Throat  . Headache     (Consider location/radiation/quality/duration/timing/severity/associated sxs/prior Treatment) HPI Megan [name] is a 19 y.o. female with history of migraines, acid reflux, presents to emergency department complaining of sore throat. She states all throat started 5 hours ago after she woke up from a nap. She denies any other associated symptoms. She denies any fever, chills, congestion, cough. She states her niece was just diagnosed with strep, but states he did not run a strep screen just thought that she may have it and she is being treated for it. No treatment prior to arrival.  Past Medical History  Diagnosis Date  . Migraines   . GERD (gastroesophageal reflux disease)   . Gallstones    Past Surgical History  Procedure Laterality Date  . Tumor removal      L foot   Family History  Problem Relation Age of Onset  . Heart disease Father   . Hypertension Father   . Diabetes Father   . Stroke Father   . Depression Father    History  Substance Use Topics  . Smoking status: Never Smoker   . Smokeless tobacco: Not on file  . Alcohol Use: No   OB History    No data available     Review of Systems  Constitutional: Negative for fever and chills.  HENT: Positive for sore throat. Negative for congestion and voice change.   Respiratory: Negative for cough, chest tightness and shortness of breath.   Cardiovascular: Negative for chest pain, palpitations and leg swelling.  Gastrointestinal: Negative for nausea, vomiting, abdominal pain and diarrhea.  Genitourinary: Negative for dysuria and flank pain.  Musculoskeletal: Negative for myalgias, arthralgias, neck pain and neck stiffness.  Skin: Negative for rash.  Neurological: Negative for dizziness, weakness and headaches.  All other  systems reviewed and are negative.     Allergies  Review of patient's allergies indicates no known allergies.  Home Medications   Prior to Admission medications   Medication Sig Start Date End Date Taking? Authorizing Provider  cyclobenzaprine (FLEXERIL) 10 MG tablet Take 1 tablet (10 mg total) by mouth 2 (two) times daily as needed for muscle spasms. 01/07/15   Kaitlyn Szekalski, PA-C  famotidine (PEPCID) 20 MG tablet Take 1 tablet (20 mg total) by mouth 2 (two) times daily. 08/20/14   Nicole Pisciotta, PA-C  HYDROcodone-acetaminophen (NORCO/VICODIN) 5-325 MG per tablet Take 2 tablets by mouth every 4 (four) hours as needed. 01/07/15   Kaitlyn Szekalski, PA-C  ibuprofen (ADVIL,MOTRIN) 800 MG tablet Take 800 mg by mouth every 8 (eight) hours as needed for headache.     Historical Provider, MD  ibuprofen (ADVIL,MOTRIN) 800 MG tablet Take 1 tablet (800 mg total) by mouth 3 (three) times daily. 09/13/14   Arby Barrette, MD  omeprazole (PRILOSEC) 20 MG capsule Take 20 mg by mouth daily.    Historical Provider, MD  ondansetron (ZOFRAN-ODT) 8 MG disintegrating tablet Take 8 mg by mouth every 8 (eight) hours as needed for nausea.    Historical Provider, MD  propranolol (INDERAL) 10 MG tablet Take 20-30 mg by mouth 2 (two) times daily. Takes 2 tablets in am and 3 tablets in pm    Historical Provider, MD  topiramate (TOPAMAX) 25 MG tablet Take 75 mg by mouth at bedtime.  Historical Provider, MD  traMADol (ULTRAM) 50 MG tablet Take 1 tablet (50 mg total) by mouth every 6 (six) hours as needed. 08/20/14   Nicole Pisciotta, PA-C   BP 136/85 mmHg  Pulse 101  Temp(Src) 98.9 F (37.2 C) (Oral)  Resp 20  Ht 5\' 2"  (1.575 m)  Wt 180 lb (81.647 kg)  BMI 32.91 kg/m2  SpO2 100%  LMP 12/12/2014 (Approximate) Physical Exam  Constitutional: She is oriented to person, place, and time. She appears well-developed and well-nourished. No distress.  HENT:  Head: Normocephalic and atraumatic.  Right Ear:  Tympanic membrane, external ear and ear canal normal.  Left Ear: Tympanic membrane, external ear and ear canal normal.  Nose: Nose normal. No mucosal edema.  Mouth/Throat: Uvula is midline and mucous membranes are normal. Posterior oropharyngeal erythema present. No oropharyngeal exudate or posterior oropharyngeal edema.  Eyes: Conjunctivae are normal.  Neck: Neck supple.  Cardiovascular: Normal rate, regular rhythm and normal heart sounds.   Pulmonary/Chest: Effort normal and breath sounds normal. No respiratory distress. She has no wheezes. She has no rales.  Abdominal: Soft. Bowel sounds are normal. She exhibits no distension. There is no tenderness. There is no rebound.  Musculoskeletal: She exhibits no edema.  Lymphadenopathy:    She has no cervical adenopathy.  Neurological: She is alert and oriented to person, place, and time.  Skin: Skin is warm and dry.  Psychiatric: She has a normal mood and affect. Her behavior is normal.  Nursing note and vitals reviewed.   ED Course  Procedures (including critical care time) Labs Review Labs Reviewed  RAPID STREP SCREEN - Abnormal; Notable for the following:    Streptococcus, Group A Screen (Direct) POSITIVE (*)    All other components within normal limits    Imaging Review No results found.   EKG Interpretation None      MDM   Final diagnoses:  Strep pharyngitis     patient was so throat onset a few hours ago. Recent contact with strep positive family member. She is afebrile, nontoxic appearing. Oropharynx is erythematous, no exudate or tonsillar swelling. Uvula is midline. Evidence peritonsillar abscess. No difficulty swallowing or breathing. Strep screen obtained.   Rapid strep is positive. Patient chose to be treated with Bicillin IM in emergency department. 1.2 million units of Bicillin administered. Home with Tylenol, Motrin, salt water gargles. Follow-up as needed. Return if worsening symptoms  Filed Vitals:    02/05/15 0008  BP: 136/85  Pulse: 101  Temp: 98.9 F (37.2 C)  TempSrc: Oral  Resp: 20  Height: 5\' 2"  (1.575 m)  Weight: 180 lb (81.647 kg)  SpO2: 100%       Megan Crumbleatyana Alyrica Thurow, PA-C 02/08/15 1546  Mirian MoMatthew Gentry, MD 02/11/15 0008

## 2015-02-22 ENCOUNTER — Encounter (HOSPITAL_COMMUNITY): Payer: Self-pay | Admitting: Emergency Medicine

## 2015-02-22 DIAGNOSIS — O99611 Diseases of the digestive system complicating pregnancy, first trimester: Secondary | ICD-10-CM | POA: Diagnosis not present

## 2015-02-22 DIAGNOSIS — K219 Gastro-esophageal reflux disease without esophagitis: Secondary | ICD-10-CM | POA: Insufficient documentation

## 2015-02-22 DIAGNOSIS — Z791 Long term (current) use of non-steroidal anti-inflammatories (NSAID): Secondary | ICD-10-CM | POA: Diagnosis not present

## 2015-02-22 DIAGNOSIS — R1013 Epigastric pain: Secondary | ICD-10-CM | POA: Diagnosis not present

## 2015-02-22 DIAGNOSIS — Z79899 Other long term (current) drug therapy: Secondary | ICD-10-CM | POA: Diagnosis not present

## 2015-02-22 DIAGNOSIS — Z3A01 Less than 8 weeks gestation of pregnancy: Secondary | ICD-10-CM | POA: Insufficient documentation

## 2015-02-22 DIAGNOSIS — G43909 Migraine, unspecified, not intractable, without status migrainosus: Secondary | ICD-10-CM | POA: Diagnosis not present

## 2015-02-22 DIAGNOSIS — O99351 Diseases of the nervous system complicating pregnancy, first trimester: Secondary | ICD-10-CM | POA: Diagnosis not present

## 2015-02-22 DIAGNOSIS — O9989 Other specified diseases and conditions complicating pregnancy, childbirth and the puerperium: Secondary | ICD-10-CM | POA: Diagnosis present

## 2015-02-22 NOTE — ED Notes (Signed)
Pt. reports mid abdominal pain with nausea and positive home pregnancy test this week , denies fever or chills. No diarrhea. No vaginal bleeding or discharge.

## 2015-02-23 ENCOUNTER — Emergency Department (HOSPITAL_COMMUNITY)
Admission: EM | Admit: 2015-02-23 | Discharge: 2015-02-23 | Disposition: A | Discharge status: Home / Self Care | Payer: Medicaid Other | Attending: Emergency Medicine | Admitting: Emergency Medicine

## 2015-02-23 DIAGNOSIS — R1010 Upper abdominal pain, unspecified: Secondary | ICD-10-CM

## 2015-02-23 LAB — COMPREHENSIVE METABOLIC PANEL
ALK PHOS: 77 U/L (ref 38–126)
ALT: 19 U/L (ref 14–54)
AST: 21 U/L (ref 15–41)
Albumin: 3.6 g/dL (ref 3.5–5.0)
Anion gap: 9 (ref 5–15)
BILIRUBIN TOTAL: 0.3 mg/dL (ref 0.3–1.2)
BUN: 9 mg/dL (ref 6–20)
CHLORIDE: 103 mmol/L (ref 101–111)
CO2: 23 mmol/L (ref 22–32)
CREATININE: 0.77 mg/dL (ref 0.44–1.00)
Calcium: 9.6 mg/dL (ref 8.9–10.3)
GFR calc Af Amer: >60 mL/min (ref >60)
GFR calc non Af Amer: >60 mL/min (ref >60)
Glucose, Bld: 94 mg/dL (ref 70–99)
POTASSIUM: 3.8 mmol/L (ref 3.5–5.1)
Sodium: 135 mmol/L (ref 135–145)
Total Protein: 7 g/dL (ref 6.5–8.1)

## 2015-02-23 LAB — CBC WITH DIFFERENTIAL/PLATELET
BASOS ABS: 0 10*3/uL (ref 0.0–0.1)
Basophils Relative: 0 % (ref 0–1)
Eosinophils Absolute: 0.1 10*3/uL (ref 0.0–0.7)
Eosinophils Relative: 1 % (ref 0–5)
HCT: 38.8 % (ref 36.0–46.0)
Hemoglobin: 12.6 g/dL (ref 12.0–15.0)
Lymphocytes Relative: 36 % (ref 12–46)
Lymphs Abs: 3.4 10*3/uL (ref 0.7–4.0)
MCH: 28.5 pg (ref 26.0–34.0)
MCHC: 32.5 g/dL (ref 30.0–36.0)
MCV: 87.8 fL (ref 78.0–100.0)
Monocytes Absolute: 0.9 10*3/uL (ref 0.1–1.0)
Monocytes Relative: 9 % (ref 3–12)
NEUTROS ABS: 5.1 10*3/uL (ref 1.7–7.7)
NEUTROS PCT: 54 % (ref 43–77)
PLATELETS: 357 10*3/uL (ref 150–400)
RBC: 4.42 MIL/uL (ref 3.87–5.11)
RDW: 13 % (ref 11.5–15.5)
WBC: 9.4 10*3/uL (ref 4.0–10.5)

## 2015-02-23 LAB — URINALYSIS, ROUTINE W REFLEX MICROSCOPIC
Bilirubin Urine: NEGATIVE
Glucose, UA: NEGATIVE mg/dL
Ketones, ur: NEGATIVE mg/dL
LEUKOCYTES UA: NEGATIVE
Nitrite: NEGATIVE
PROTEIN: NEGATIVE mg/dL
Specific Gravity, Urine: 1.016 (ref 1.005–1.030)
UROBILINOGEN UA: 1 mg/dL (ref 0.0–1.0)
pH: 6.5 (ref 5.0–8.0)

## 2015-02-23 LAB — URINE MICROSCOPIC-ADD ON

## 2015-02-23 LAB — LIPASE, BLOOD: Lipase: 26 U/L (ref 22–51)

## 2015-02-23 LAB — POC URINE PREG, ED: Preg Test, Ur: POSITIVE — AB

## 2015-02-23 MED ORDER — ONDANSETRON 4 MG PO TBDP: 4.0000 mg | ORAL_TABLET | Freq: Three times a day (TID) | ORAL | Status: DC | PRN

## 2015-02-23 MED ORDER — OMEPRAZOLE 20 MG PO CPDR: 20.0000 mg | DELAYED_RELEASE_CAPSULE | Freq: Every day | ORAL | Status: DC

## 2015-02-23 NOTE — Discharge Instructions (Signed)
Avoid all NSAIDs including ibuprofen, naproxen and similar. You may take Tylenol for pain. Make an appointment to follow-up with an OB/GYN. Return immediately for lower abdominal pain, vaginal bleeding or any concerns.  Abdominal Pain During Pregnancy Abdominal pain is common in pregnancy. Most of the time, it does not cause harm. There are many causes of abdominal pain. Some causes are more serious than others. Some of the causes of abdominal pain in pregnancy are easily diagnosed. Occasionally, the diagnosis takes time to understand. Other times, the cause is not determined. Abdominal pain can be a sign that something is very wrong with the pregnancy, or the pain may have nothing to do with the pregnancy at all. For this reason, always tell your health care provider if you have any abdominal discomfort. HOME CARE INSTRUCTIONS  Monitor your abdominal pain for any changes. The following actions may help to alleviate any discomfort you are experiencing:  Do not have sexual intercourse or put anything in your vagina until your symptoms go away completely.  Get plenty of rest until your pain improves.  Drink clear fluids if you feel nauseous. Avoid solid food as long as you are uncomfortable or nauseous.  Only take over-the-counter or prescription medicine as directed by your health care provider.  Keep all follow-up appointments with your health care provider. SEEK IMMEDIATE MEDICAL CARE IF:  You are bleeding, leaking fluid, or passing tissue from the vagina.  You have increasing pain or cramping.  You have persistent vomiting.  You have painful or bloody urination.  You have a fever.  You notice a decrease in your baby's movements.  You have extreme weakness or feel faint.  You have shortness of breath, with or without abdominal pain.  You develop a severe headache with abdominal pain.  You have abnormal vaginal discharge with abdominal pain.  You have persistent diarrhea.  You  have abdominal pain that continues even after rest, or gets worse. MAKE SURE YOU:   Understand these instructions.  Will watch your condition.  Will get help right away if you are not doing well or get worse. Document Released: 10/09/2005 Document Revised: 07/30/2013 Document Reviewed: 05/08/2013 [facility] Patient Information 2015 [place], [place]. This information is not intended to replace advice given to you by your health care provider. Make sure you discuss any questions you have with your health care provider. Gastritis, Adult Gastritis is soreness and swelling (inflammation) of the lining of the stomach. Gastritis can develop as a sudden onset (acute) or long-term (chronic) condition. If gastritis is not treated, it can lead to stomach bleeding and ulcers. CAUSES  Gastritis occurs when the stomach lining is weak or damaged. Digestive juices from the stomach then inflame the weakened stomach lining. The stomach lining may be weak or damaged due to viral or bacterial infections. One common bacterial infection is the Helicobacter pylori infection. Gastritis can also result from excessive alcohol consumption, taking certain medicines, or having too much acid in the stomach.  SYMPTOMS  In some cases, there are no symptoms. When symptoms are present, they may include:  Pain or a burning sensation in the upper abdomen.  Nausea.  Vomiting.  An uncomfortable feeling of fullness after eating. DIAGNOSIS  Your caregiver may suspect you have gastritis based on your symptoms and a physical exam. To determine the cause of your gastritis, your caregiver may perform the following:  Blood or stool tests to check for the H pylori bacterium.  Gastroscopy. A thin, flexible tube (endoscope) is passed  down the esophagus and into the stomach. The endoscope has a light and camera on the end. Your caregiver uses the endoscope to view the inside of the stomach.  Taking a tissue sample (biopsy) from the  stomach to examine under a microscope. TREATMENT  Depending on the cause of your gastritis, medicines may be prescribed. If you have a bacterial infection, such as an H pylori infection, antibiotics may be given. If your gastritis is caused by too much acid in the stomach, H2 blockers or antacids may be given. Your caregiver may recommend that you stop taking aspirin, ibuprofen, or other nonsteroidal anti-inflammatory drugs (NSAIDs). HOME CARE INSTRUCTIONS  Only take over-the-counter or prescription medicines as directed by your caregiver.  If you were given antibiotic medicines, take them as directed. Finish them even if you start to feel better.  Drink enough fluids to keep your urine clear or pale yellow.  Avoid foods and drinks that make your symptoms worse, such as:  Caffeine or alcoholic drinks.  Chocolate.  Peppermint or mint flavorings.  Garlic and onions.  Spicy foods.  Citrus fruits, such as oranges, lemons, or limes.  Tomato-based foods such as sauce, chili, salsa, and pizza.  Fried and fatty foods.  Eat small, frequent meals instead of large meals. SEEK IMMEDIATE MEDICAL CARE IF:   You have black or dark red stools.  You vomit blood or material that looks like coffee grounds.  You are unable to keep fluids down.  Your abdominal pain gets worse.  You have a fever.  You do not feel better after 1 week.  You have any other questions or concerns. MAKE SURE YOU:  Understand these instructions.  Will watch your condition.  Will get help right away if you are not doing well or get worse. Document Released: 10/03/2001 Document Revised: 04/09/2012 Document Reviewed: 11/22/2011 [facility] Patient Information 2015 [place]. This information is not intended to replace advice given to you by your health care provider. Make sure you discuss any questions you have with your health care provider.

## 2015-02-23 NOTE — ED Provider Notes (Signed)
CSN: [id number]     Arrival date & time 02/22/15  2328 History   First MD Initiated Contact with Patient 02/23/15 0156     This chart was scribed for No att. providers found by Arlan Organ, ED Scribe. This patient was seen in room D35C/D35C and the patient's care was started 1:58 AM.   Chief Complaint  Patient presents with  . Abdominal Pain   The history is provided by the patient. No language interpreter was used.    HPI Comments: Megan [name] is a 19 y.o. female with a PMHx of GERD, migraines, and gallstones who presents to the Emergency Department complaining of intermittent, ongoing, unchanged abdominal pain x 3-4 days. Pt also reports reports ongoing nausea after eating. 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 No recent vaginal bleeding or vaginal discharge. No fever, dysuria, or chills. Pt recently took two at home pregnancy tests yesterday with a positive and negative result. No history of abdominal surgeries. LNMP end of March. No known allergies to medications.  Past Medical History  Diagnosis Date  . Migraines   . GERD (gastroesophageal reflux disease)   . Gallstones    Past Surgical History  Procedure Laterality Date  . Tumor removal      L foot   Family History  Problem Relation Age of Onset  . Heart disease Father   . Hypertension Father   . Diabetes Father   . Stroke Father   . Depression Father    History  Substance Use Topics  . Smoking status: Never Smoker   . Smokeless tobacco: Not on file  . Alcohol Use: No   OB History    No data available     Review of Systems  Constitutional: Negative for fever and chills.  Respiratory: Negative for shortness of breath.   Cardiovascular: Negative for chest pain.  Gastrointestinal: Positive for nausea and abdominal pain. Negative for vomiting, diarrhea and constipation.  Genitourinary: Negative for hematuria, flank pain, vaginal bleeding and vaginal discharge.  Musculoskeletal: Negative for  myalgias, back pain, neck pain and neck stiffness.  Skin: Negative for rash.  Neurological: Positive for headaches. Negative for dizziness, weakness, light-headedness and numbness.  Psychiatric/Behavioral: Negative for confusion.  All other systems reviewed and are negative.     Allergies  Review of patient's allergies indicates no known allergies.  Home Medications   Prior to Admission medications   Medication Sig Start Date End Date Taking? Authorizing Provider  cyclobenzaprine (FLEXERIL) 10 MG tablet Take 1 tablet (10 mg total) by mouth 2 (two) times daily as needed for muscle spasms. 01/07/15   Kaitlyn Szekalski, PA-C  famotidine (PEPCID) 20 MG tablet Take 1 tablet (20 mg total) by mouth 2 (two) times daily. 08/20/14   Nicole Pisciotta, PA-C  HYDROcodone-acetaminophen (NORCO/VICODIN) 5-325 MG per tablet Take 2 tablets by mouth every 4 (four) hours as needed. 01/07/15   Kaitlyn Szekalski, PA-C  ibuprofen (ADVIL,MOTRIN) 800 MG tablet Take 800 mg by mouth every 8 (eight) hours as needed for headache.     Historical Provider, MD  ibuprofen (ADVIL,MOTRIN) 800 MG tablet Take 1 tablet (800 mg total) by mouth 3 (three) times daily. 09/13/14   Arby Barrette, MD  omeprazole (PRILOSEC) 20 MG capsule Take 1 capsule (20 mg total) by mouth daily. 02/23/15   Loren Racer, MD  ondansetron (ZOFRAN-ODT) 4 MG disintegrating tablet Take 1 tablet (4 mg total) by mouth every 8 (eight) hours as needed for nausea or vomiting. 02/23/15   Onalee Hua  Ranae PalmsYelverton, MD  propranolol (INDERAL) 10 MG tablet Take 20-30 mg by mouth 2 (two) times daily. Takes 2 tablets in am and 3 tablets in pm    Historical Provider, MD  topiramate (TOPAMAX) 25 MG tablet Take 75 mg by mouth at bedtime.     Historical Provider, MD  traMADol (ULTRAM) 50 MG tablet Take 1 tablet (50 mg total) by mouth every 6 (six) hours as needed. 08/20/14   Nicole Pisciotta, PA-C   Triage Vitals: BP 93/55 mmHg  Pulse 81  Temp(Src) 98.2 F (36.8 C) (Oral)  Resp  18  Ht 5\' 1"  (1.549 m)  Wt 189 lb (85.73 kg)  BMI 35.73 kg/m2  SpO2 98%  LMP 01/18/2015   Physical Exam  Constitutional: She is oriented to person, place, and time. She appears well-developed and well-nourished. No distress.  HENT:  Head: Normocephalic and atraumatic.  Mouth/Throat: Oropharynx is clear and moist.  Eyes: EOM are normal. Pupils are equal, round, and reactive to light.  Neck: Normal range of motion. Neck supple.  Cardiovascular: Normal rate and regular rhythm.   Pulmonary/Chest: Effort normal and breath sounds normal. No respiratory distress. She has no wheezes. She has no rales.  Abdominal: Soft. Bowel sounds are normal. She exhibits no distension and no mass. There is tenderness (mild epigastric tenderness with palpation.). There is no rebound and no guarding.  Genitourinary: Vagina normal. No vaginal discharge found.  The cervical motion tenderness. No fundal or adnexal tenderness. No vaginal bleeding or discharge.  Musculoskeletal: Normal range of motion. She exhibits no edema or tenderness.  No CVA tenderness bilaterally.  Neurological: She is alert and oriented to person, place, and time.  Skin: Skin is warm and dry. No rash noted. No erythema.  Psychiatric: She has a normal mood and affect. Her behavior is normal.  Nursing note and vitals reviewed.   ED Course  Procedures (including critical care time)  DIAGNOSTIC STUDIES: Oxygen Saturation is 98% on RA, Normal by my interpretation.    COORDINATION OF CARE: 2:13 AM- Will order CBC, CMP, Lipase, urine pregnancy, and urinalysis. Discussed treatment plan with pt at bedside and pt agreed to plan.     Labs Review Labs Reviewed  URINALYSIS, ROUTINE W REFLEX MICROSCOPIC - Abnormal; Notable for the following:    Hgb urine dipstick SMALL (*)    All other components within normal limits  URINE MICROSCOPIC-ADD ON - Abnormal; Notable for the following:    Squamous Epithelial / LPF FEW (*)    Bacteria, UA MANY (*)     All other components within normal limits  POC URINE PREG, ED - Abnormal; Notable for the following:    Preg Test, Ur POSITIVE (*)    All other components within normal limits  CBC WITH DIFFERENTIAL/PLATELET  COMPREHENSIVE METABOLIC PANEL  LIPASE, BLOOD    Imaging Review No results found.   EKG Interpretation None      MDM   Final diagnoses:  Upper abdominal pain    I personally performed the services described in this documentation, which was scribed in my presence. The recorded information has been reviewed and is accurate.  Patient with mild upper abdominal pain which is improved at this point. She has no vaginal symptoms. Doubt ectopic. Patient is advised to follow-up with primary physician. Advised to avoid all NSAIDs. May use over-the-counter Mylanta or Maalox. Return precautions given.   Loren Raceravid Demario Faniel, MD 03/04/15 [phone]

## 2015-03-24 ENCOUNTER — Inpatient Hospital Stay (HOSPITAL_COMMUNITY)
Admission: AD | Admit: 2015-03-24 | Discharge: 2015-03-24 | Disposition: A | Discharge status: Home / Self Care | Payer: Medicaid Other | Source: Ambulatory Visit | Attending: Obstetrics & Gynecology | Admitting: Obstetrics & Gynecology

## 2015-03-24 ENCOUNTER — Encounter (HOSPITAL_COMMUNITY): Payer: Self-pay

## 2015-03-24 DIAGNOSIS — Z3A09 9 weeks gestation of pregnancy: Secondary | ICD-10-CM | POA: Insufficient documentation

## 2015-03-24 DIAGNOSIS — G43901 Migraine, unspecified, not intractable, with status migrainosus: Secondary | ICD-10-CM | POA: Diagnosis not present

## 2015-03-24 DIAGNOSIS — G43909 Migraine, unspecified, not intractable, without status migrainosus: Secondary | ICD-10-CM | POA: Insufficient documentation

## 2015-03-24 DIAGNOSIS — R51 Headache: Secondary | ICD-10-CM | POA: Diagnosis present

## 2015-03-24 DIAGNOSIS — O99351 Diseases of the nervous system complicating pregnancy, first trimester: Secondary | ICD-10-CM | POA: Diagnosis not present

## 2015-03-24 LAB — URINE MICROSCOPIC-ADD ON

## 2015-03-24 LAB — URINALYSIS, ROUTINE W REFLEX MICROSCOPIC
Bilirubin Urine: NEGATIVE
GLUCOSE, UA: NEGATIVE mg/dL
KETONES UR: NEGATIVE mg/dL
LEUKOCYTES UA: NEGATIVE
NITRITE: NEGATIVE
Protein, ur: NEGATIVE mg/dL
Specific Gravity, Urine: 1.01 (ref 1.005–1.030)
UROBILINOGEN UA: 2 mg/dL — AB (ref 0.0–1.0)
pH: 6.5 (ref 5.0–8.0)

## 2015-03-24 MED ORDER — METOCLOPRAMIDE HCL 5 MG/ML IJ SOLN
10.0000 mg | Freq: Once | INTRAMUSCULAR | Status: AC
Start: 2015-03-24 — End: 2015-03-24
  Administered 2015-03-24: 10 mg via INTRAVENOUS
  Filled 2015-03-24: qty 2

## 2015-03-24 MED ORDER — BUTALBITAL-APAP-CAFFEINE 50-325-40 MG PO TABS: 1.0000 | ORAL_TABLET | Freq: Four times a day (QID) | ORAL | Status: DC | PRN

## 2015-03-24 MED ORDER — DEXAMETHASONE SODIUM PHOSPHATE 10 MG/ML IJ SOLN
10.0000 mg | Freq: Once | INTRAMUSCULAR | Status: AC
  Administered 2015-03-24: 10 mg via INTRAVENOUS
  Filled 2015-03-24: qty 1

## 2015-03-24 MED ORDER — DIPHENHYDRAMINE HCL 50 MG/ML IJ SOLN
25.0000 mg | Freq: Once | INTRAMUSCULAR | Status: AC
  Administered 2015-03-24: 25 mg via INTRAVENOUS
  Filled 2015-03-24: qty 1

## 2015-03-24 MED ORDER — LACTATED RINGERS IV BOLUS (SEPSIS)
1000.0000 mL | Freq: Once | INTRAVENOUS | Status: AC
  Administered 2015-03-24: 1000 mL via INTRAVENOUS

## 2015-03-24 NOTE — Discharge Instructions (Signed)

## 2015-03-24 NOTE — MAU Note (Signed)
Pt presents complaining of a migraine that started yesterday. Tried tyelnol, rest and water and it is not helping. Denies vaginal bleeding or discharge.

## 2015-03-24 NOTE — MAU Provider Note (Signed)
History     CSN: [id number]  Arrival date and time: 03/24/15 1830   First Provider Initiated Contact with Patient 03/24/15 2022      Chief Complaint  Patient presents with  . Headache   HPI Comments: Megan [name] is a 19 y.o. G1P0 at [redacted]w[redacted]d Has had unrelenting frontal and left parietal headache since last night. Onset was associated with nausea however she's been having intermittent nausea for 2-3 weeks. Denies vomiting. Denies visual aura. States she has frequent mild headaches as well as occasional severe headaches which she believes are migraines. Stress with pregnancy.   Headache  This is a recurrent problem. The current episode started yesterday. The problem occurs constantly. The problem has been gradually worsening. The pain is located in the frontal region. The pain does not radiate. The pain quality is similar to prior headaches. The quality of the pain is described as throbbing and stabbing. The pain is at a severity of 8/10. The pain is severe. Associated symptoms include nausea. Pertinent negatives include no abdominal pain, blurred vision, dizziness, ear pain, eye pain, fever, neck pain, numbness, phonophobia, photophobia, scalp tenderness, sore throat, tingling, visual change or weakness. The symptoms are aggravated by bright light. She has tried acetaminophen for the symptoms. The treatment provided no relief. Her past medical history is significant for migraine headaches. There is no history of recent head traumas.     Past Medical History  Diagnosis Date  . Migraines   . GERD (gastroesophageal reflux disease)   . Gallstones    . Past Surgical History  Procedure Laterality Date  . Tumor removal      L foot    Family History  Problem Relation Age of Onset  . Heart disease Father   . Hypertension Father   . Diabetes Father   . Stroke Father   . Depression Father     History  Substance Use Topics  . Smoking status: Never Smoker   . Smokeless tobacco: Not on  file  . Alcohol Use: No    Allergies: No Known Allergies  Prescriptions prior to admission  Medication Sig Dispense Refill Last Dose  . acetaminophen (TYLENOL) 500 MG tablet Take 500 mg by mouth every 6 (six) hours as needed for headache.   03/24/2015 at 1300  . flintstones complete (FLINTSTONES) 60 MG chewable tablet Chew 1 tablet by mouth daily.   03/23/2015 at Unknown time  . ondansetron (ZOFRAN-ODT) 4 MG disintegrating tablet Take 1 tablet (4 mg total) by mouth every 8 (eight) hours as needed for nausea or vomiting. 10 tablet 0 Past Month at Unknown time  . cyclobenzaprine (FLEXERIL) 10 MG tablet Take 1 tablet (10 mg total) by mouth 2 (two) times daily as needed for muscle spasms. (Patient not taking: Reported on 03/24/2015) 20 tablet 0 Not Taking at Unknown time  . famotidine (PEPCID) 20 MG tablet Take 1 tablet (20 mg total) by mouth 2 (two) times daily. (Patient not taking: Reported on 03/24/2015) 30 tablet 0 Not Taking at Unknown time  . HYDROcodone-acetaminophen (NORCO/VICODIN) 5-325 MG per tablet Take 2 tablets by mouth every 4 (four) hours as needed. (Patient not taking: Reported on 03/24/2015) 10 tablet 0 Not Taking at Unknown time  . ibuprofen (ADVIL,MOTRIN) 800 MG tablet Take 1 tablet (800 mg total) by mouth 3 (three) times daily. (Patient not taking: Reported on 03/24/2015) 21 tablet 0 Not Taking at Unknown time  . omeprazole (PRILOSEC) 20 MG capsule Take 1 capsule (20 mg total) by mouth  daily. (Patient not taking: Reported on 03/24/2015) 30 capsule 0 Not Taking at Unknown time  . traMADol (ULTRAM) 50 MG tablet Take 1 tablet (50 mg total) by mouth every 6 (six) hours as needed. (Patient not taking: Reported on 03/24/2015) 15 tablet 0 Not Taking at Unknown time    Review of Systems  Constitutional: Negative for fever.  HENT: Negative for ear pain and sore throat.   Eyes: Negative for blurred vision, photophobia and pain.  Gastrointestinal: Positive for nausea. Negative for abdominal pain.   Musculoskeletal: Negative for neck pain.  Neurological: Positive for headaches. Negative for dizziness, tingling, weakness and numbness.   Physical Exam   Blood pressure 129/70, pulse 83, temperature 98 F (36.7 C), temperature source Oral, resp. rate 18, height 5\' 1"  (1.549 m), weight 83.462 kg (184 lb), last menstrual period 01/18/2015.  Physical Exam  Nursing note and vitals reviewed. Constitutional: She is oriented to person, place, and time. She appears well-developed and well-nourished. No distress.  HENT:  Head: Normocephalic and atraumatic.  Eyes: EOM are normal. Pupils are equal, round, and reactive to light.  Neck: Normal range of motion. Neck supple.  Cardiovascular: Normal rate.   Respiratory: Effort normal. No respiratory distress.  GI: Soft. She exhibits no distension. There is no tenderness.  Musculoskeletal: Normal range of motion.  Neurological: She is alert and oriented to person, place, and time. Coordination normal.  Skin: Skin is warm and dry.  Psychiatric: She has a normal mood and affect. Her behavior is normal. Judgment and thought content normal.    MAU Course  Procedures Decadron 10mg Ceasar Mons/Reglan 10mg /Benadryl 25mg  IV given in LR 1000 Bedside ultrasound by me> normal fetal cardiac activity Care assumed by Joseph BerkshireJulie Ethier PA at 2100  POE,DEIRDRE 03/24/2015, 8:22 PM   MDM 2115 - care assumed from Caren Griffinseirdre Poe, CNM. Patient receiving IV fluids and headache medications Patient reports improvement in pain and is resting comfortably in MAU prior to discharge  Assessment and Plan  A: SIUP at 5160w2d Migraine   P: Discharge home Rx for Fioricet given to patient First trimester precautions discussed Patient advised to follow-up with [facility] to start prenatal care as scheduled Patient may return to MAU as needed or if her condition were to change or worsen   Marny LowensteinJulie N Mervil Wacker, PA-C  03/24/2015 9:14 PM

## 2015-04-02 ENCOUNTER — Ambulatory Visit (INDEPENDENT_AMBULATORY_CARE_PROVIDER_SITE_OTHER): Payer: Medicaid Other | Admitting: Certified Nurse Midwife

## 2015-04-02 ENCOUNTER — Encounter: Payer: Self-pay | Admitting: Certified Nurse Midwife

## 2015-04-02 VITALS — BP 129/81 | HR 88 | Temp 98.5°F | Wt 183.0 lb

## 2015-04-02 DIAGNOSIS — Z3401 Encounter for supervision of normal first pregnancy, first trimester: Secondary | ICD-10-CM

## 2015-04-02 DIAGNOSIS — G43919 Migraine, unspecified, intractable, without status migrainosus: Secondary | ICD-10-CM

## 2015-04-02 DIAGNOSIS — O219 Vomiting of pregnancy, unspecified: Secondary | ICD-10-CM

## 2015-04-02 LAB — POCT URINALYSIS DIPSTICK
BILIRUBIN UA: NEGATIVE
Blood, UA: 250
GLUCOSE UA: NEGATIVE
KETONES UA: NEGATIVE
LEUKOCYTES UA: NEGATIVE
NITRITE UA: NEGATIVE
SPEC GRAV UA: 1.01
Urobilinogen, UA: 4
pH, UA: 8

## 2015-04-02 MED ORDER — BUTALBITAL-APAP-CAFFEINE 50-325-40 MG PO TABS: 1.0000 | ORAL_TABLET | Freq: Four times a day (QID) | ORAL | Status: DC | PRN

## 2015-04-02 MED ORDER — DOXYLAMINE-PYRIDOXINE 10-10 MG PO TBEC: 2.0000 | DELAYED_RELEASE_TABLET | Freq: Every day | ORAL | Status: DC

## 2015-04-02 MED ORDER — VITAFOL ULTRA 29-0.6-0.4-200 MG PO CAPS: 1.0000 | ORAL_CAPSULE | Freq: Every day | ORAL | Status: DC

## 2015-04-02 NOTE — Patient Instructions (Signed)
Heartburn During Pregnancy  Heartburn is a burning sensation in the chest caused by stomach acid backing up into the esophagus. Heartburn is common in pregnancy because a certain hormone (progesterone) is released when a woman is pregnant. The progesterone hormone may relax the valve that separates the esophagus from the stomach. This allows acid to go up into the esophagus, causing heartburn. Heartburn may also happen in pregnancy because the enlarging uterus pushes up on the stomach, which pushes more acid into the esophagus. This is especially true in the later stages of pregnancy. Heartburn problems usually go away after giving birth. CAUSES  Heartburn is caused by stomach acid backing up into the esophagus. During pregnancy, this may result from various things, including:   The progesterone hormone.  Changing hormone levels.  The growing uterus pushing stomach acid upward.  Large meals.  Certain foods and drinks.  Exercise.  Increased acid production. SIGNS AND SYMPTOMS   Burning pain in the chest or lower throat.  Bitter taste in the mouth.  Coughing. DIAGNOSIS  Your health care provider will typically diagnose heartburn by taking a careful history of your concern. Blood tests may be done to check for a certain type of bacteria that is associated with heartburn. Sometimes, heartburn is diagnosed by prescribing a heartburn medicine to see if the symptoms improve. In some cases, a procedure called an endoscopy may be done. In this procedure, a tube with a light and a camera on the end (endoscope) is used to examine the esophagus and the stomach. TREATMENT  Treatment will vary depending on the severity of your symptoms. Your health care provider may recommend:  Over-the-counter medicines (antacids, acid reducers) for mild heartburn.  Prescription medicines to decrease stomach acid or to protect your stomach lining.  Certain changes in your diet.  Elevating the head of your bed  by putting blocks under the legs. This helps prevent stomach acid from backing up into the esophagus when you are lying down. HOME CARE INSTRUCTIONS   Only take over-the-counter or prescription medicines as directed by your health care provider.  Raise the head of your bed by putting blocks under the legs if instructed to do so by your health care provider. Sleeping with more pillows is not effective because it only changes the position of your head.  Do not exercise right after eating.  Avoid eating 2-3 hours before bed. Do not lie down right after eating.  Eat small meals throughout the day instead of three large meals.  Identify foods and beverages that make your symptoms worse and avoid them. Foods you may want to avoid include:  Peppers.  Chocolate.  High-fat foods, including fried foods.  Spicy foods.  Garlic and onions.  Citrus fruits, including oranges, grapefruit, lemons, and limes.  Food containing tomatoes or tomato products.  Mint.  Carbonated and caffeinated drinks.  Vinegar. SEEK MEDICAL CARE IF:  You have abdominal pain of any kind.  You feel burning in your upper abdomen or chest, especially after eating or lying down.  You have nausea and vomiting.  Your stomach feels upset after you eat. SEEK IMMEDIATE MEDICAL CARE IF:   You have severe chest pain that goes down your arm or into your jaw or neck.  You feel sweaty, dizzy, or light-headed.  You become short of breath.  You vomit blood.  You have difficulty or pain with swallowing.  You have bloody or black, tarry stools.  You have episodes of heartburn more than 3 times a  week, for more than 2 weeks. MAKE SURE YOU:  Understand these instructions.  Will watch your condition.  Will get help right away if you are not doing well or get worse. Document Released: 10/06/2000 Document Revised: 10/14/2013 Document Reviewed: 05/28/2013 [facility] Patient Information 2015 [place], [place]. This  information is not intended to replace advice given to you by your health care provider. Make sure you discuss any questions you have with your health care provider. How a Baby Grows During Pregnancy Pregnancy begins when the female's sperm enters the female's egg. This happens in the fallopian tube and is called fertilization. The fertilized egg is called an embryo until it reaches 9 weeks from the time of fertilization. From 9 weeks until birth it is called a fetus. The fertilized egg moves down the tube into the uterus and attaches to the inside lining of the uterus.  The pregnant woman is responsible for the growth of the embryo/fetus by supplying nourishment and oxygen through the blood stream and placenta to the developing fetus. The uterus becomes larger and pops out from the abdomen more and more as the fetus develops and grows. A normal pregnancy lasts 280 days, with a range of 259 to 294 days, or 40 weeks. The pregnancy is divided up into three trimesters:  First trimester - 0 to 13 weeks.  Second trimester - 14 to 27 weeks.  Third trimester - 28 to 40 weeks. The day your baby is supposed to be born is called estimated date of confinement [facility]) or estimated date of delivery (EDD). GROWTH OF THE BABY MONTH BY MONTH  First Month: The fertilized egg attaches to the inside of the uterus and certain cells will form the placenta and others will develop into the fetus. The arms, legs, brain, spinal cord, lungs, and heart begin to develop. At the end of the first month the heart begins to beat. The embryo weighs less than an ounce and is  inch long.  Second Month: The bones can be seen, the inner ear, eye lids, hands and feet form and genitals develop. By the end of 8 weeks, all of the major organs are developing. The fetus now weighs less than an ounce and is one inch (2.54 cm) long.  Third Month: Teeth buds appear, all the internal organs are forming, bones and muscles begin to grow, the spine  can flex and the skin is transparent. Finger and toe nails begin to form, the hands develop faster than the feet and the arms are longer than the legs at this point. The fetus weighs a little more than an ounce (0.03 kg) and is 3 inches (8.89cm) long.  Fourth Month: The placenta is completely formed. The external sex organs, neck, outer ear, eyebrows, eyelids and fingernails are formed. The fetus can hear, swallow, flex its arms and legs and the kidney begins to produce urine. The skin is covered with a white waxy coating (vernix) and very thin hair (lanugo) is present. The fetus weighs 5 ounces (0.14kg) and is 6 to 7 inches (16.51cm) long.  Fifth Month: The fetus moves around more and can be felt for the first time (called quickening), sleeps and wakes up at times, may begin to suck its finger and the nails grow to the end of the fingers. The gallbladder is now functioning and helps to digest the nutrients, eggs are formed in the female and the testicles begin to drop down from the abdomen to the scrotum in the female. The fetus weighs  to 1 pound (0.45kg) and is 10 inches (25.4cm) long.  Sixth Month: The lungs are formed but the fetus does not breath yet. The eyes open, the brain develops more quickly at this time, one can detect finger and toe prints and thicker hair grows. The fetus weighs 1 to 1 pounds (0.68kg) and is 12 inches (30.48cm) long.  Seventh Month: The fetus can hear and respond to sounds, kicks and stretches and can sense changes in light. The fetus weighs 2 to 2 pounds (1.13kg) and is 14 inches (35.56cm) long.  Eight Month: All organs and body systems are fully developed and functioning. The bones get harder, taste buds develop and can taste sweet and sour flavors and the fetus may hiccup now. Different parts of the brain are developing and the skull remains soft for the brain to grow. The fetus weighs 5 pounds (2.27kg) and is 18 inches (45.75cm) long.  Ninth Month: The fetus gains  about a half a pound a week, the lungs are fully developed, patterns of sleep develop and the head moves down into the bottom of the uterus called vertex. If the buttocks moves into the bottom of the uterus, it is called a breech. The fetus weighs 6 to 9 pounds (2.72 to 4.08kg) and is 20 inches (50.8cm) long. You should be informed about your pregnancy, yourself and how the baby is developing as much as possible. Being informed helps you to enjoy this experience. It also gives you the sense to feel if something is not going right and when to ask questions. Talk to your caregiver when you have questions about your baby or your own body. Document Released: 03/27/2008 Document Revised: 01/01/2012 Document Reviewed: 03/27/2008 [facility] Patient Information 2015 [place], [place]. This information is not intended to replace advice given to you by your health care provider. Make sure you discuss any questions you have with your health care provider. Migraine Headache A migraine headache is an intense, throbbing pain on one or both sides of your head. A migraine can last for 30 minutes to several hours. CAUSES  The exact cause of a migraine headache is not always known. However, a migraine may be caused when nerves in the brain become irritated and release chemicals that cause inflammation. This causes pain. Certain things may also trigger migraines, such as:  Alcohol.  Smoking.  Stress.  Menstruation.  Aged cheeses.  Foods or drinks that contain nitrates, glutamate, aspartame, or tyramine.  Lack of sleep.  Chocolate.  Caffeine.  Hunger.  Physical exertion.  Fatigue.  Medicines used to treat chest pain (nitroglycerine), birth control pills, estrogen, and some blood pressure medicines. SIGNS AND SYMPTOMS  Pain on one or both sides of your head.  Pulsating or throbbing pain.  Severe pain that prevents daily activities.  Pain that is aggravated by any physical activity.  Nausea,  vomiting, or both.  Dizziness.  Pain with exposure to bright lights, loud noises, or activity.  General sensitivity to bright lights, loud noises, or smells. Before you get a migraine, you may get warning signs that a migraine is coming (aura). An aura may include:  Seeing flashing lights.  Seeing bright spots, halos, or zigzag lines.  Having tunnel vision or blurred vision.  Having feelings of numbness or tingling.  Having trouble talking.  Having muscle weakness. DIAGNOSIS  A migraine headache is often diagnosed based on:  Symptoms.  Physical exam.  A CT scan or MRI of your head. These imaging tests cannot diagnose migraines, but  they can help rule out other causes of headaches. TREATMENT Medicines may be given for pain and nausea. Medicines can also be given to help prevent recurrent migraines.  HOME CARE INSTRUCTIONS  Only take over-the-counter or prescription medicines for pain or discomfort as directed by your health care provider. The use of long-term narcotics is not recommended.  Lie down in a dark, quiet room when you have a migraine.  Keep a journal to find out what may trigger your migraine headaches. For example, write down:  What you eat and drink.  How much sleep you get.  Any change to your diet or medicines.  Limit alcohol consumption.  Quit smoking if you smoke.  Get 7-9 hours of sleep, or as recommended by your health care provider.  Limit stress.  Keep lights dim if bright lights bother you and make your migraines worse. SEEK IMMEDIATE MEDICAL CARE IF:   Your migraine becomes severe.  You have a fever.  You have a stiff neck.  You have vision loss.  You have muscular weakness or loss of muscle control.  You start losing your balance or have trouble walking.  You feel faint or pass out.  You have severe symptoms that are different from your first symptoms. MAKE SURE YOU:   Understand these instructions.  Will watch your  condition.  Will get help right away if you are not doing well or get worse. Document Released: 10/09/2005 Document Revised: 02/23/2014 Document Reviewed: 06/16/2013 [facility] Patient Information 2015 [place], [place]. This information is not intended to replace advice given to you by your health care provider. Make sure you discuss any questions you have with your health care provider. Morning Sickness Morning sickness is when you feel sick to your stomach (nauseous) during pregnancy. This nauseous feeling may or may not come with vomiting. It often occurs in the morning but can be a problem any time of day. Morning sickness is most common during the first trimester, but it may continue throughout pregnancy. While morning sickness is unpleasant, it is usually harmless unless you develop severe and continual vomiting (hyperemesis gravidarum). This condition requires more intense treatment.  CAUSES  The cause of morning sickness is not completely known but seems to be related to normal hormonal changes that occur in pregnancy. RISK FACTORS You are at greater risk if you:  Experienced nausea or vomiting before your pregnancy.  Had morning sickness during a previous pregnancy.  Are pregnant with more than one baby, such as twins. TREATMENT  Do not use any medicines (prescription, over-the-counter, or herbal) for morning sickness without first talking to your health care provider. Your health care provider may prescribe or recommend:  Vitamin B6 supplements.  Anti-nausea medicines.  The herbal medicine ginger. HOME CARE INSTRUCTIONS   Only take over-the-counter or prescription medicines as directed by your health care provider.  Taking multivitamins before getting pregnant can prevent or decrease the severity of morning sickness in most women.  Eat a piece of dry toast or unsalted crackers before getting out of bed in the morning.  Eat five or six small meals a day.  Eat dry and bland  foods (rice, baked potato). Foods high in carbohydrates are often helpful.  Do not drink liquids with your meals. Drink liquids between meals.  Avoid greasy, fatty, and spicy foods.  Get someone to cook for you if the smell of any food causes nausea and vomiting.  If you feel nauseous after taking prenatal vitamins, take the vitamins at night  or with a snack.  Snack on protein foods (nuts, yogurt, cheese) between meals if you are hungry.  Eat unsweetened gelatins for desserts.  Wearing an acupressure wristband (worn for sea sickness) may be helpful.  Acupuncture may be helpful.  Do not smoke.  Get a humidifier to keep the air in your house free of odors.  Get plenty of fresh air. SEEK MEDICAL CARE IF:   Your home remedies are not working, and you need medicine.  You feel dizzy or lightheaded.  You are losing weight. SEEK IMMEDIATE MEDICAL CARE IF:   You have persistent and uncontrolled nausea and vomiting.  You pass out (faint). MAKE SURE YOU:  Understand these instructions.  Will watch your condition.  Will get help right away if you are not doing well or get worse. Document Released: 11/30/2006 Document Revised: 10/14/2013 Document Reviewed: 03/26/2013 [facility] Patient Information 2015 [place], [place]. This information is not intended to replace advice given to you by your health care provider. Make sure you discuss any questions you have with your health care provider. First Trimester of Pregnancy The first trimester of pregnancy is from week 1 until the end of week 12 (months 1 through 3). A week after a sperm fertilizes an egg, the egg will implant on the wall of the uterus. This embryo will begin to develop into a baby. Genes from you and your partner are forming the baby. The female genes determine whether the baby is a boy or a girl. At 6-8 weeks, the eyes and face are formed, and the heartbeat can be seen on ultrasound. At the end of 12 weeks, all the baby's  organs are formed.  Now that you are pregnant, you will want to do everything you can to have a healthy baby. Two of the most important things are to get good prenatal care and to follow your health care provider's instructions. Prenatal care is all the medical care you receive before the baby's birth. This care will help prevent, find, and treat any problems during the pregnancy and childbirth. BODY CHANGES Your body goes through many changes during pregnancy. The changes vary from woman to woman.   You may gain or lose a couple of pounds at first.  You may feel sick to your stomach (nauseous) and throw up (vomit). If the vomiting is uncontrollable, call your health care provider.  You may tire easily.  You may develop headaches that can be relieved by medicines approved by your health care provider.  You may urinate more often. Painful urination may mean you have a bladder infection.  You may develop heartburn as a result of your pregnancy.  You may develop constipation because certain hormones are causing the muscles that push waste through your intestines to slow down.  You may develop hemorrhoids or swollen, bulging veins (varicose veins).  Your breasts may begin to grow larger and become tender. Your nipples may stick out more, and the tissue that surrounds them (areola) may become darker.  Your gums may bleed and may be sensitive to brushing and flossing.  Dark spots or blotches (chloasma, mask of pregnancy) may develop on your face. This will likely fade after the baby is born.  Your menstrual periods will stop.  You may have a loss of appetite.  You may develop cravings for certain kinds of food.  You may have changes in your emotions from day to day, such as being excited to be pregnant or being concerned that something may go wrong  with the pregnancy and baby.  You may have more vivid and strange dreams.  You may have changes in your hair. These can include thickening  of your hair, rapid growth, and changes in texture. Some women also have hair loss during or after pregnancy, or hair that feels dry or thin. Your hair will most likely return to normal after your baby is born. WHAT TO EXPECT AT YOUR PRENATAL VISITS During a routine prenatal visit:  You will be weighed to make sure you and the baby are growing normally.  Your blood pressure will be taken.  Your abdomen will be measured to track your baby's growth.  The fetal heartbeat will be listened to starting around week 10 or 12 of your pregnancy.  Test results from any previous visits will be discussed. Your health care provider may ask you:  How you are feeling.  If you are feeling the baby move.  If you have had any abnormal symptoms, such as leaking fluid, bleeding, severe headaches, or abdominal cramping.  If you have any questions. Other tests that may be performed during your first trimester include:  Blood tests to find your blood type and to check for the presence of any previous infections. They will also be used to check for low iron levels (anemia) and Rh antibodies. Later in the pregnancy, blood tests for diabetes will be done along with other tests if problems develop.  Urine tests to check for infections, diabetes, or protein in the urine.  An ultrasound to confirm the proper growth and development of the baby.  An amniocentesis to check for possible genetic problems.  Fetal screens for spina bifida and Down syndrome.  You may need other tests to make sure you and the baby are doing well. HOME CARE INSTRUCTIONS  Medicines  Follow your health care provider's instructions regarding medicine use. Specific medicines may be either safe or unsafe to take during pregnancy.  Take your prenatal vitamins as directed.  If you develop constipation, try taking a stool softener if your health care provider approves. Diet  Eat regular, well-balanced meals. Choose a variety of foods,  such as meat or vegetable-based protein, fish, milk and low-fat dairy products, vegetables, fruits, and whole grain breads and cereals. Your health care provider will help you determine the amount of weight gain that is right for you.  Avoid raw meat and uncooked cheese. These carry germs that can cause birth defects in the baby.  Eating four or five small meals rather than three large meals a day may help relieve nausea and vomiting. If you start to feel nauseous, eating a few soda crackers can be helpful. Drinking liquids between meals instead of during meals also seems to help nausea and vomiting.  If you develop constipation, eat more high-fiber foods, such as fresh vegetables or fruit and whole grains. Drink enough fluids to keep your urine clear or pale yellow. Activity and Exercise  Exercise only as directed by your health care provider. Exercising will help you:  Control your weight.  Stay in shape.  Be prepared for labor and delivery.  Experiencing pain or cramping in the lower abdomen or low back is a good sign that you should stop exercising. Check with your health care provider before continuing normal exercises.  Try to avoid standing for long periods of time. Move your legs often if you must stand in one place for a long time.  Avoid heavy lifting.  Wear low-heeled shoes, and practice good posture.  You may continue to have sex unless your health care provider directs you otherwise. Relief of Pain or Discomfort  Wear a good support bra for breast tenderness.   Take warm sitz baths to soothe any pain or discomfort caused by hemorrhoids. Use hemorrhoid cream if your health care provider approves.   Rest with your legs elevated if you have leg cramps or low back pain.  If you develop varicose veins in your legs, wear support hose. Elevate your feet for 15 minutes, 3-4 times a day. Limit salt in your diet. Prenatal Care  Schedule your prenatal visits by the twelfth  week of pregnancy. They are usually scheduled monthly at first, then more often in the last 2 months before delivery.  Write down your questions. Take them to your prenatal visits.  Keep all your prenatal visits as directed by your health care provider. Safety  Wear your seat belt at all times when driving.  Make a list of emergency phone numbers, including numbers for family, friends, the hospital, and police and fire departments. General Tips  Ask your health care provider for a referral to a local prenatal education class. Begin classes no later than at the beginning of month 6 of your pregnancy.  Ask for help if you have counseling or nutritional needs during pregnancy. Your health care provider can offer advice or refer you to specialists for help with various needs.  Do not use hot tubs, steam rooms, or saunas.  Do not douche or use tampons or scented sanitary pads.  Do not cross your legs for long periods of time.  Avoid cat litter boxes and soil used by cats. These carry germs that can cause birth defects in the baby and possibly loss of the fetus by miscarriage or stillbirth.  Avoid all smoking, herbs, alcohol, and medicines not prescribed by your health care provider. Chemicals in these affect the formation and growth of the baby.  Schedule a dentist appointment. At home, brush your teeth with a soft toothbrush and be gentle when you floss. SEEK MEDICAL CARE IF:   You have dizziness.  You have mild pelvic cramps, pelvic pressure, or nagging pain in the abdominal area.  You have persistent nausea, vomiting, or diarrhea.  You have a bad smelling vaginal discharge.  You have pain with urination.  You notice increased swelling in your face, hands, legs, or ankles. SEEK IMMEDIATE MEDICAL CARE IF:   You have a fever.  You are leaking fluid from your vagina.  You have spotting or bleeding from your vagina.  You have severe abdominal cramping or pain.  You have  rapid weight gain or loss.  You vomit blood or material that looks like coffee grounds.  You are exposed to [place] measles and have never had them.  You are exposed to fifth disease or chickenpox.  You develop a severe headache.  You have shortness of breath.  You have any kind of trauma, such as from a fall or a car accident. Document Released: 10/03/2001 Document Revised: 02/23/2014 Document Reviewed: 08/19/2013 [facility] Patient Information 2015 [place], [place]. This information is not intended to replace advice given to you by your health care provider. Make sure you discuss any questions you have with your health care provider.

## 2015-04-02 NOTE — Progress Notes (Signed)
Subjective:    Megan [name] is being seen today for her first obstetrical visit.  This is not a planned pregnancy. She is at 102w4d gestation. Her obstetrical history is significant for none. Relationship with FOB: significant other, not living together. Patient does intend to breast feed. Pregnancy history fully reviewed.  Twins run in the family.  Nausea in the AM.    The information documented in the HPI was reviewed and verified.  Menstrual History: OB History    Gravida Para Term Preterm AB TAB SAB Ectopic Multiple Living   1               Menarche age: 78  Patient's last menstrual period was 01/18/2015.      Past Medical History  Diagnosis Date  . Migraines   . GERD (gastroesophageal reflux disease)   . Gallstones     Past Surgical History  Procedure Laterality Date  . Tumor removal      L foot     (Not in a hospital admission) No Known Allergies  History  Substance Use Topics  . Smoking status: Never Smoker   . Smokeless tobacco: Not on file  . Alcohol Use: No    Family History  Problem Relation Age of Onset  . Heart disease Father   . Hypertension Father   . Diabetes Father   . Stroke Father   . Depression Father      Review of Systems Constitutional: negative for weight loss Gastrointestinal: negative for vomiting Genitourinary:negative for genital lesions and vaginal discharge and dysuria Musculoskeletal:negative for back pain Behavioral/Psych: negative for abusive relationship, depression, illegal drug usage and tobacco use    Objective:    BP 129/81 mmHg  Pulse 88  Temp(Src) 98.5 F (36.9 C)  Wt 183 lb (83.008 kg)  LMP 01/18/2015 General Appearance:    Alert, cooperative, no distress, appears stated age  Head:    Normocephalic, without obvious abnormality, atraumatic  Eyes:    PERRL, conjunctiva/corneas clear, EOM's intact, fundi    benign, both eyes  Ears:    Normal TM's and external ear canals, both ears  Nose:   Nares normal, septum  midline, mucosa normal, no drainage    or sinus tenderness  Throat:   Lips, mucosa, and tongue normal; teeth and gums normal  Neck:   Supple, symmetrical, trachea midline, no adenopathy;    thyroid:  no enlargement/tenderness/nodules; no carotid   bruit or JVD  Back:     Symmetric, no curvature, ROM normal, no CVA tenderness  Lungs:     Clear to auscultation bilaterally, respirations unlabored  Chest Wall:    No tenderness or deformity   Heart:    Regular rate and rhythm, S1 and S2 normal, no murmur, rub   or gallop  Breast Exam:    No tenderness, masses, or nipple abnormality  Abdomen:     Soft, non-tender, bowel sounds active all four quadrants,    no masses, no organomegaly  Genitalia:    Normal female without lesion, discharge or tenderness  Extremities:   Extremities normal, atraumatic, no cyanosis or edema  Pulses:   2+ and symmetric all extremities  Skin:   Skin color, texture, turgor normal, no rashes or lesions  Lymph nodes:   Cervical, supraclavicular, and axillary nodes normal  Neurologic:   CNII-XII intact, normal strength, sensation and reflexes    throughout    Cervix:   Long, thick, closed.  Fundus about 10-12 week size.  Lab Review Urine pregnancy test Labs reviewed yes Radiologic studies reviewed no Assessment:    Pregnancy at [redacted]w[redacted]d weeks    Plan:      Prenatal vitamins.  Counseling provided regarding continued use of seat belts, cessation of alcohol consumption, smoking or use of illicit drugs; infection precautions i.e., influenza/TDAP immunizations, toxoplasmosis,CMV, parvovirus, listeria and varicella; workplace safety, exercise during pregnancy; routine dental care, safe medications, sexual activity, hot tubs, saunas, pools, travel, caffeine use, fish and methlymercury, potential toxins, hair treatments, varicose veins Weight gain recommendations per IOM guidelines reviewed: underweight/BMI< 18.5--> gain 28 - 40 lbs; normal weight/BMI 18.5 - 24.9--> gain 25  - 35 lbs; overweight/BMI 25 - 29.9--> gain 15 - 25 lbs; obese/BMI >30->gain  11 - 20 lbs Problem list reviewed and updated. FIRST/CF mutation testing/NIPT/QUAD SCREEN/fragile X/Ashkenazi Jewish population testing/Spinal muscular atrophy discussed: requested. Role of ultrasound in pregnancy discussed; fetal survey: requested. Amniocentesis discussed: not indicated.   Meds ordered this encounter  Medications  . butalbital-acetaminophen-caffeine (FIORICET) 50-325-40 MG per tablet    Sig: Take 1-2 tablets by mouth every 6 (six) hours as needed for headache.    Dispense:  40 tablet    Refill:  3  . Prenat-Fe Poly-Methfol-FA-DHA (VITAFOL ULTRA) 29-0.6-0.4-200 MG CAPS    Sig: Take 1 tablet by mouth daily.    Dispense:  30 capsule    Refill:  12  . Doxylamine-Pyridoxine (DICLEGIS) 10-10 MG TBEC    Sig: Take 2 tablets by mouth at bedtime.    Dispense:  100 tablet    Refill:  3   Orders Placed This Encounter  Procedures  . Culture, OB Urine  . SureSwab, Vaginosis/Vaginitis Plus  . US OB Transvaginal    Standing Status: Future     Number of Occurrences:      Standing Expiration Date: 06/01/2016    Order Specific Question:  Reason for Exam (SYMPTOM  OR DIAGNOSIS REQUIRED)    Answer:  unsure of LMP, hx of twins in family    Order Specific Question:  Preferred imaging location?    Answer:  Internal  . Obstetric panel  . HIV antibody  . Hemoglobinopathy evaluation  . Varicella zoster antibody, IgG  . Vit D  25 hydroxy (rtn osteoporosis monitoring)  . TSH  . POCT urinalysis dipstick    Follow up in 4 weeks. 50% of 30 min visit spent on counseling and coordination of care.

## 2015-04-03 LAB — TSH: TSH: 0.956 u[IU]/mL (ref 0.350–4.500)

## 2015-04-03 LAB — VITAMIN D 25 HYDROXY (VIT D DEFICIENCY, FRACTURES): Vit D, 25-Hydroxy: 17 ng/mL — ABNORMAL LOW (ref 30–100)

## 2015-04-03 LAB — HIV ANTIBODY (ROUTINE TESTING W REFLEX): HIV 1&2 Ab, 4th Generation: NONREACTIVE

## 2015-04-04 LAB — CULTURE, OB URINE: Colony Count: 30000

## 2015-04-05 LAB — OBSTETRIC PANEL
Antibody Screen: NEGATIVE
BASOS ABS: 0 10*3/uL (ref 0.0–0.1)
Basophils Relative: 0 % (ref 0–1)
Eosinophils Absolute: 0.1 10*3/uL (ref 0.0–0.7)
Eosinophils Relative: 1 % (ref 0–5)
HEMATOCRIT: 37.5 % (ref 36.0–46.0)
HEMOGLOBIN: 12.5 g/dL (ref 12.0–15.0)
HEP B S AG: NEGATIVE
Lymphocytes Relative: 22 % (ref 12–46)
Lymphs Abs: 2.4 10*3/uL (ref 0.7–4.0)
MCH: 29.8 pg (ref 26.0–34.0)
MCHC: 33.3 g/dL (ref 30.0–36.0)
MCV: 89.3 fL (ref 78.0–100.0)
MONO ABS: 1 10*3/uL (ref 0.1–1.0)
MPV: 9.3 fL (ref 8.6–12.4)
Monocytes Relative: 9 % (ref 3–12)
NEUTROS ABS: 7.5 10*3/uL (ref 1.7–7.7)
Neutrophils Relative %: 68 % (ref 43–77)
Platelets: 349 10*3/uL (ref 150–400)
RBC: 4.2 MIL/uL (ref 3.87–5.11)
RDW: 13.8 % (ref 11.5–15.5)
RH TYPE: POSITIVE
Rubella: 0.79 Index (ref <0.90)
WBC: 11 10*3/uL — ABNORMAL HIGH (ref 4.0–10.5)

## 2015-04-05 LAB — VARICELLA ZOSTER ANTIBODY, IGG: VARICELLA IGG: 594.1 {index} — AB (ref <135.00)

## 2015-04-06 LAB — SURESWAB, VAGINOSIS/VAGINITIS PLUS
ATOPOBIUM VAGINAE: NOT DETECTED Log (cells/mL)
C. TROPICALIS, DNA: NOT DETECTED
C. albicans, DNA: NOT DETECTED
C. glabrata, DNA: NOT DETECTED
C. parapsilosis, DNA: DETECTED — AB
C. trachomatis RNA, TMA: NOT DETECTED
Gardnerella vaginalis: 4.9 Log (cells/mL)
LACTOBACILLUS SPECIES: 7.5 Log (cells/mL)
MEGASPHAERA SPECIES: NOT DETECTED Log (cells/mL)
N. gonorrhoeae RNA, TMA: NOT DETECTED
T. vaginalis RNA, QL TMA: NOT DETECTED

## 2015-04-06 LAB — HEMOGLOBINOPATHY EVALUATION
HGB A2 QUANT: 3.2 % (ref 2.2–3.2)
HGB A: 96.8 % (ref 96.8–97.8)
HGB S QUANTITAION: 0 %
Hemoglobin Other: 0 %
Hgb F Quant: 0 % (ref 0.0–2.0)

## 2015-04-07 ENCOUNTER — Other Ambulatory Visit: Payer: Self-pay | Admitting: Certified Nurse Midwife

## 2015-04-07 DIAGNOSIS — B373 Candidiasis of vulva and vagina: Secondary | ICD-10-CM

## 2015-04-07 DIAGNOSIS — B3731 Acute candidiasis of vulva and vagina: Secondary | ICD-10-CM

## 2015-04-07 MED ORDER — TERCONAZOLE 0.4 % VA CREA: 1.0000 | TOPICAL_CREAM | Freq: Every day | VAGINAL | Status: DC

## 2015-04-09 ENCOUNTER — Other Ambulatory Visit: Payer: Self-pay | Admitting: Certified Nurse Midwife

## 2015-04-09 ENCOUNTER — Ambulatory Visit (HOSPITAL_COMMUNITY)
Admission: RE | Admit: 2015-04-09 | Discharge: 2015-04-09 | Disposition: A | Discharge status: Home / Self Care | Payer: Medicaid Other | Source: Ambulatory Visit | Attending: Certified Nurse Midwife | Admitting: Certified Nurse Midwife

## 2015-04-09 DIAGNOSIS — Z3A13 13 weeks gestation of pregnancy: Secondary | ICD-10-CM | POA: Insufficient documentation

## 2015-04-09 DIAGNOSIS — Z3401 Encounter for supervision of normal first pregnancy, first trimester: Secondary | ICD-10-CM

## 2015-04-09 DIAGNOSIS — O288 Other abnormal findings on antenatal screening of mother: Secondary | ICD-10-CM | POA: Diagnosis present

## 2015-04-17 ENCOUNTER — Encounter (HOSPITAL_COMMUNITY): Payer: Self-pay

## 2015-04-17 ENCOUNTER — Inpatient Hospital Stay (HOSPITAL_COMMUNITY)
Admission: AD | Admit: 2015-04-17 | Discharge: 2015-04-17 | Disposition: A | Discharge status: Home / Self Care | Payer: Medicaid Other | Source: Ambulatory Visit | Attending: Obstetrics | Admitting: Obstetrics

## 2015-04-17 DIAGNOSIS — Z3A12 12 weeks gestation of pregnancy: Secondary | ICD-10-CM | POA: Insufficient documentation

## 2015-04-17 DIAGNOSIS — Z3493 Encounter for supervision of normal pregnancy, unspecified, third trimester: Secondary | ICD-10-CM | POA: Diagnosis present

## 2015-04-17 DIAGNOSIS — O2342 Unspecified infection of urinary tract in pregnancy, second trimester: Secondary | ICD-10-CM

## 2015-04-17 LAB — URINALYSIS, ROUTINE W REFLEX MICROSCOPIC
Bilirubin Urine: NEGATIVE
Glucose, UA: NEGATIVE mg/dL
Ketones, ur: NEGATIVE mg/dL
Nitrite: NEGATIVE
Protein, ur: NEGATIVE mg/dL
SPECIFIC GRAVITY, URINE: 1.01 (ref 1.005–1.030)
UROBILINOGEN UA: 0.2 mg/dL (ref 0.0–1.0)
pH: 6 (ref 5.0–8.0)

## 2015-04-17 LAB — URINE MICROSCOPIC-ADD ON

## 2015-04-17 MED ORDER — NITROFURANTOIN MONOHYD MACRO 100 MG PO CAPS: 100.0000 mg | ORAL_CAPSULE | Freq: Two times a day (BID) | ORAL | Status: DC

## 2015-04-17 MED ORDER — PHENAZOPYRIDINE HCL 200 MG PO TABS: 200.0000 mg | ORAL_TABLET | Freq: Three times a day (TID) | ORAL | Status: DC | PRN

## 2015-04-17 NOTE — MAU Provider Note (Signed)
Chief Complaint: Abdominal Pain   First Provider Initiated Contact with Patient 04/17/15 0405      SUBJECTIVE HPI: Megan [name] is a 19 y.o. G1P0 at [redacted]w[redacted]d by LMP who presents to Maternity Admissions reporting now, intermittent low abdominal cramping and urgency and frequency of urination since yesterday. Has not tried anything to treat the pain. There are no aggravating or alleviating factors. Denies dysuria, hematuria or flank pain. Pelvic exam with wet prep and cultures in the office 04/02/2015. Results were normal. Gets prenatal care at [facility]  Past Medical History  Diagnosis Date  . Migraines   . GERD (gastroesophageal reflux disease)   . Gallstones    OB History  Gravida Para Term Preterm AB SAB TAB Ectopic Multiple Living  1             # Outcome Date GA Lbr Len/2nd Weight Sex Delivery Anes PTL Lv  1 Current              Past Surgical History  Procedure Laterality Date  . Tumor removal      L foot   History   Social History  . Marital Status: Single    Spouse Name: N/A  . Number of Children: N/A  . Years of Education: N/A   Occupational History  . Not on file.   Social History Main Topics  . Smoking status: Never Smoker   . Smokeless tobacco: Not on file  . Alcohol Use: No  . Drug Use: No  . Sexual Activity: Yes   Other Topics Concern  . Not on file   Social History Narrative   No current facility-administered medications on file prior to encounter.   Current Outpatient Prescriptions on File Prior to Encounter  Medication Sig Dispense Refill  . acetaminophen (TYLENOL) 500 MG tablet Take 500 mg by mouth every 6 (six) hours as needed for headache.    . flintstones complete (FLINTSTONES) 60 MG chewable tablet Chew 1 tablet by mouth daily.    . butalbital-acetaminophen-caffeine (FIORICET) 50-325-40 MG per tablet Take 1-2 tablets by mouth every 6 (six) hours as needed for headache. 40 tablet 3  . Doxylamine-Pyridoxine (DICLEGIS) 10-10 MG TBEC Take 2 tablets  by mouth at bedtime. 100 tablet 3  . ondansetron (ZOFRAN-ODT) 4 MG disintegrating tablet Take 1 tablet (4 mg total) by mouth every 8 (eight) hours as needed for nausea or vomiting. (Patient not taking: Reported on 04/02/2015) 10 tablet 0  . Prenat-Fe Poly-Methfol-FA-DHA (VITAFOL ULTRA) 29-0.6-0.4-200 MG CAPS Take 1 tablet by mouth daily. 30 capsule 12  . terconazole (TERAZOL 7) 0.4 % vaginal cream Place 1 applicator vaginally at bedtime. 45 g 0   No Known Allergies  Review of Systems  Constitutional: Negative for fever and chills.  Gastrointestinal: Positive for abdominal pain. Negative for nausea, vomiting, diarrhea and constipation.  Genitourinary: Positive for urgency and frequency. Negative for dysuria, hematuria and flank pain.       Negative for vaginal bleeding or vaginal discharge.    OBJECTIVE Blood pressure 125/67, pulse 74, temperature 98.7 F (37.1 C), temperature source Oral, resp. rate 16, height 5\' 1"  (1.549 m), weight 183 lb 9.6 oz (83.28 kg), last menstrual period 01/18/2015. GENERAL: Well-developed, well-nourished female in no acute distress.  HEART: normal rate RESP: normal effort GI: Abdomen soft, non-tender. Positive bowel sounds 4. Gravid, size equals dates. MS: Nontender, no edema NEURO: Alert and oriented GU EXAM: Declined speculum exam. Cervix long and closed per vaginal exam. No CVA tenderness.  LAB  RESULTS Results for orders placed or performed during the hospital encounter of 04/17/15 (from the past 24 hour(s))  Urinalysis, Routine w reflex microscopic (not at [facility])     Status: Abnormal   Collection Time: 04/17/15  1:50 AM  Result Value Ref Range   Color, Urine YELLOW YELLOW   APPearance CLEAR CLEAR   Specific Gravity, Urine 1.010 1.005 - 1.030   pH 6.0 5.0 - 8.0   Glucose, UA NEGATIVE NEGATIVE mg/dL   Hgb urine dipstick SMALL (A) NEGATIVE   Bilirubin Urine NEGATIVE NEGATIVE   Ketones, ur NEGATIVE NEGATIVE mg/dL   Protein, ur NEGATIVE NEGATIVE mg/dL    Urobilinogen, UA 0.2 0.0 - 1.0 mg/dL   Nitrite NEGATIVE NEGATIVE   Leukocytes, UA SMALL (A) NEGATIVE  Urine microscopic-add on     Status: Abnormal   Collection Time: 04/17/15  1:50 AM  Result Value Ref Range   Squamous Epithelial / LPF FEW (A) RARE   WBC, UA 3-6 <3 WBC/hpf   RBC / HPF 7-10 <3 RBC/hpf   Bacteria, UA FEW (A) RARE    IMAGING NA  MAU COURSE UA. Patient declines repeat gonorrhea, Chlamydia cultures and wet prep.  ASSESSMENT 1. UTI in pregnancy, antepartum, second trimester    PLAN Discharge home in stable condition. OB urine culture pending. Increase fluids. Return to maternity admissions for worsening pain or symptoms of pyelonephritis.     Follow-up Information    Follow up with HARPER,CHARLES A, MD On 04/30/2015.   Specialty:  Obstetrics and Gynecology   Why:  Routine prenatal visit or sooner As needed if symptoms worsen   Contact information:   [address] [place] [address] [phone]       Follow up with [facility] [place] [facility].   Why:  As needed in emergencies   Contact information:   [address] [id number] mc [place] [address] [phone]       Medication List    TAKE these medications        acetaminophen 500 MG tablet  Commonly known as:  TYLENOL  Take 500 mg by mouth every 6 (six) hours as needed for headache.     butalbital-acetaminophen-caffeine 50-325-40 MG per tablet  Commonly known as:  FIORICET  Take 1-2 tablets by mouth every 6 (six) hours as needed for headache.     Doxylamine-Pyridoxine 10-10 MG Tbec  Commonly known as:  DICLEGIS  Take 2 tablets by mouth at bedtime.     flintstones complete 60 MG chewable tablet  Chew 1 tablet by mouth daily.     nitrofurantoin (macrocrystal-monohydrate) 100 MG capsule  Commonly known as:  MACROBID  Take 1 capsule (100 mg total) by mouth 2 (two) times daily.     ondansetron 4 MG disintegrating  tablet  Commonly known as:  ZOFRAN-ODT  Take 1 tablet (4 mg total) by mouth every 8 (eight) hours as needed for nausea or vomiting.     phenazopyridine 200 MG tablet  Commonly known as:  PYRIDIUM  Take 1 tablet (200 mg total) by mouth 3 (three) times daily as needed for pain.     ranitidine 150 MG tablet  Commonly known as:  ZANTAC  Take 150 mg by mouth 2 (two) times daily. Pt takes as needed     terconazole 0.4 % vaginal cream  Commonly known as:  TERAZOL 7  Place 1 applicator vaginally at bedtime.     VITAFOL ULTRA 29-0.6-0.4-200 MG Caps  Take  1 tablet by mouth daily.         [place], CNM 04/17/2015  4:22 AM

## 2015-04-17 NOTE — MAU Note (Signed)
Pain in lower abd off and on since Friday morning. Denies bleeding or d/c

## 2015-04-17 NOTE — Discharge Instructions (Signed)
Pregnancy and Urinary Tract Infection °A urinary tract infection (UTI) is a bacterial infection of the urinary tract. Infection of the urinary tract can include the ureters, kidneys (pyelonephritis), bladder (cystitis), and urethra (urethritis). All pregnant women should be screened for bacteria in the urinary tract. Identifying and treating a UTI will decrease the risk of preterm labor and developing more serious infections in both the mother and baby. °CAUSES °Bacteria germs cause almost all UTIs.  °RISK FACTORS °Many factors can increase your chances of getting a UTI during pregnancy. These include: °· Having a short urethra. °· Poor toilet and hygiene habits. °· Sexual intercourse. °· Blockage of urine along the urinary tract. °· Problems with the pelvic muscles or nerves. °· Diabetes. °· Obesity. °· Bladder problems after having several children. °· Previous history of UTI. °SIGNS AND SYMPTOMS  °· Pain, burning, or a stinging feeling when urinating. °· Suddenly feeling the need to urinate right away (urgency). °· Loss of bladder control (urinary incontinence). °· Frequent urination, more than is common with pregnancy. °· Lower abdominal or back discomfort. °· Cloudy urine. °· Blood in the urine (hematuria). °· Fever.  °When the kidneys are infected, the symptoms may be: °· Back pain. °· Flank pain on the right side more so than the left. °· Fever. °· Chills. °· Nausea. °· Vomiting. °DIAGNOSIS  °A urinary tract infection is usually diagnosed through urine tests. Additional tests and procedures are sometimes done. These may include: °· Ultrasound exam of the kidneys, ureters, bladder, and urethra. °· Looking in the bladder with a lighted tube (cystoscopy). °TREATMENT °Typically, UTIs can be treated with antibiotic medicines.  °HOME CARE INSTRUCTIONS  °· Only take over-the-counter or prescription medicines as directed by your health care provider. If you were prescribed antibiotics, take them as directed. Finish  them even if you start to feel better. °· Drink enough fluids to keep your urine clear or pale yellow. °· Do not have sexual intercourse until the infection is gone and your health care provider says it is okay. °· Make sure you are tested for UTIs throughout your pregnancy. These infections often come back.  °Preventing a UTI in the Future °· Practice good toilet habits. Always wipe from front to back. Use the tissue only once. °· Do not hold your urine. Empty your bladder as soon as possible when the urge comes. °· Do not douche or use deodorant sprays. °· Wash with soap and warm water around the genital area and the anus. °· Empty your bladder before and after sexual intercourse. °· Wear underwear with a cotton crotch. °· Avoid caffeine and carbonated drinks. They can irritate the bladder. °· Drink cranberry juice or take cranberry pills. This may decrease the risk of getting a UTI. °· Do not drink alcohol. °· Keep all your appointments and tests as scheduled.  °SEEK MEDICAL CARE IF:  °· Your symptoms get worse. °· You are still having fevers 2 or more days after treatment begins. °· You have a rash. °· You feel that you are having problems with medicines prescribed. °· You have abnormal vaginal discharge. °SEEK IMMEDIATE MEDICAL CARE IF:  °· You have back or flank pain. °· You have chills. °· You have blood in your urine. °· You have nausea and vomiting. °· You have contractions of your uterus. °· You have a gush of fluid from the vagina. °MAKE SURE YOU: °· Understand these instructions.   °· Will watch your condition.   °· Will get help right away if you are not doing   well or get worse.   °Document Released: 02/03/2011 Document Revised: 07/30/2013 Document Reviewed: 05/08/2013 °[employer]® Patient Information ©2015 [employer]. This information is not intended to replace advice given to you by your health care provider. Make sure you discuss any questions you have with your health care provider. ° °

## 2015-04-18 LAB — CULTURE, OB URINE: SPECIAL REQUESTS: NORMAL

## 2015-04-30 ENCOUNTER — Encounter: Payer: Medicaid Other | Admitting: Certified Nurse Midwife

## 2015-05-04 ENCOUNTER — Ambulatory Visit (INDEPENDENT_AMBULATORY_CARE_PROVIDER_SITE_OTHER): Payer: Medicaid Other | Admitting: Certified Nurse Midwife

## 2015-05-04 VITALS — BP 137/79 | HR 82 | Temp 97.1°F | Wt 180.0 lb

## 2015-05-04 DIAGNOSIS — Z3402 Encounter for supervision of normal first pregnancy, second trimester: Secondary | ICD-10-CM | POA: Diagnosis not present

## 2015-05-04 LAB — POCT URINALYSIS DIPSTICK
BILIRUBIN UA: NEGATIVE
GLUCOSE UA: NORMAL
KETONES UA: NEGATIVE
Leukocytes, UA: NEGATIVE
NITRITE UA: NEGATIVE
PROTEIN UA: NEGATIVE
Spec Grav, UA: 1.01
Urobilinogen, UA: NEGATIVE
pH, UA: 7

## 2015-05-05 NOTE — Progress Notes (Signed)
Patient also denies fever, but has been nauseated with vomiting.  Can hold down water and crackers.  Encouraged good fluid intake and to try eating small frequent meals.  Zofran rx given for nausea.  Dehydration precautions given.

## 2015-05-05 NOTE — Progress Notes (Signed)
  Subjective:    Megan [name] is a 19 y.o. female being seen today for her obstetrical visit. She is at 253w6d gestation. Patient reports: headache, no bleeding, no contractions, no cramping and no leaking.  Reports that Fioricet is helping her HA.    Problem List Items Addressed This Visit    None    Visit Diagnoses    Encounter for supervision of normal first pregnancy in second trimester    -  Primary    Relevant Orders    POCT urinalysis dipstick (Completed)    US OB Comp + 14 Wk      Patient Active Problem List   Diagnosis Date Noted  . Abdominal pain, left lower quadrant 03/08/2013  . Migraine with status migrainosus 03/07/2013    Objective:     BP 137/79 mmHg  Pulse 82  Temp(Src) 97.1 F (36.2 C)  Wt 180 lb (81.647 kg)  LMP 01/18/2015 Uterine Size: Below umbilicus   FHR: 150's  Assessment:    Pregnancy @ 473w6d  weeks Doing well    Plan:    Problem list reviewed and updated. Labs reviewed. Follow up in 4 weeks with Quad/AFP screening. FIRST/CF mutation testing/NIPT/QUAD SCREEN/fragile X/Ashkenazi Jewish population testing/Spinal muscular atrophy discussed: requested. Role of ultrasound in pregnancy discussed; fetal survey: requested. Amniocentesis discussed: not indicated. 50% of 30 minute visit spent on counseling and coordination of care.

## 2015-05-11 ENCOUNTER — Ambulatory Visit (INDEPENDENT_AMBULATORY_CARE_PROVIDER_SITE_OTHER): Payer: Medicaid Other | Admitting: Certified Nurse Midwife

## 2015-05-11 VITALS — BP 132/83 | HR 97 | Temp 98.4°F | Wt 180.0 lb

## 2015-05-11 DIAGNOSIS — Z3402 Encounter for supervision of normal first pregnancy, second trimester: Secondary | ICD-10-CM | POA: Diagnosis not present

## 2015-05-11 LAB — POCT URINALYSIS DIPSTICK
Bilirubin, UA: NEGATIVE
Blood, UA: NEGATIVE
GLUCOSE UA: NEGATIVE
Ketones, UA: NEGATIVE
Leukocytes, UA: NEGATIVE
NITRITE UA: NEGATIVE
PROTEIN UA: NEGATIVE
UROBILINOGEN UA: NEGATIVE
pH, UA: 7

## 2015-05-11 NOTE — Progress Notes (Signed)
  Subjective:    Megan [name] is a 19 y.o. female being seen today for her obstetrical visit. She is at 4171w5d gestation. Patient reports: no complaints.  States + fetal movement.  States that N&V is gone.  HA are improved.    Problem List Items Addressed This Visit    None    Visit Diagnoses    Encounter for supervision of normal first pregnancy in second trimester    -  Primary    Relevant Orders    POCT urinalysis dipstick (Completed)    AFP, Quad Screen    US OB Comp + 14 Wk      Patient Active Problem List   Diagnosis Date Noted  . Abdominal pain, left lower quadrant 03/08/2013  . Migraine with status migrainosus 03/07/2013    Objective:     BP 132/83 mmHg  Pulse 97  Temp(Src) 98.4 F (36.9 C)  Wt 180 lb (81.647 kg)  LMP 01/18/2015 Uterine Size: Below umbilicus   FHR: 151 by doppler.    Assessment:    Pregnancy @ 8171w5d  weeks Doing well    Plan:    Problem list reviewed and updated. Labs reviewed.  Follow up in 4 weeks. FIRST/CF mutation testing/NIPT/QUAD SCREEN/fragile X/Ashkenazi Jewish population testing/Spinal muscular atrophy discussed: ordered. Role of ultrasound in pregnancy discussed; fetal survey: ordered. Amniocentesis discussed: not indicated. 50% of 15 minute visit spent on counseling and coordination of care.

## 2015-05-13 LAB — AFP, QUAD SCREEN
AFP: 35.7 ng/mL
Age Alone: 1:1160 {titer}
Curr Gest Age: 17.5 wks.days
Down Syndrome Scr Risk Est: 1:6700 {titer}
HCG TOTAL: 26.67 [IU]/mL
INH: 239 pg/mL
Interpretation-AFP: NEGATIVE
MOM FOR HCG: 1.04
MOM FOR INH: 1.66
MoM for AFP: 0.95
OPEN SPINA BIFIDA: NEGATIVE
Tri 18 Scr Risk Est: NEGATIVE
uE3 Mom: 1.55
uE3 Value: 1.75 ng/mL

## 2015-05-27 ENCOUNTER — Ambulatory Visit (INDEPENDENT_AMBULATORY_CARE_PROVIDER_SITE_OTHER): Payer: Medicaid Other

## 2015-05-27 DIAGNOSIS — Z3402 Encounter for supervision of normal first pregnancy, second trimester: Secondary | ICD-10-CM | POA: Diagnosis not present

## 2015-05-27 LAB — US OB COMP + 14 WK

## 2015-06-08 ENCOUNTER — Ambulatory Visit: Payer: Medicaid Other | Admitting: Certified Nurse Midwife

## 2015-06-08 ENCOUNTER — Other Ambulatory Visit: Payer: Self-pay | Admitting: Certified Nurse Midwife

## 2015-06-08 ENCOUNTER — Ambulatory Visit (HOSPITAL_COMMUNITY)
Admission: RE | Admit: 2015-06-08 | Discharge: 2015-06-08 | Disposition: A | Discharge status: Home / Self Care | Payer: Medicaid Other | Source: Ambulatory Visit | Attending: Certified Nurse Midwife | Admitting: Certified Nurse Midwife

## 2015-06-08 VITALS — BP 130/80 | HR 92 | Temp 98.3°F | Wt 185.0 lb

## 2015-06-08 DIAGNOSIS — Z3A21 21 weeks gestation of pregnancy: Secondary | ICD-10-CM

## 2015-06-08 DIAGNOSIS — Z1389 Encounter for screening for other disorder: Secondary | ICD-10-CM

## 2015-06-08 DIAGNOSIS — Z3402 Encounter for supervision of normal first pregnancy, second trimester: Secondary | ICD-10-CM

## 2015-06-08 DIAGNOSIS — Z36 Encounter for antenatal screening of mother: Secondary | ICD-10-CM | POA: Insufficient documentation

## 2015-06-08 DIAGNOSIS — O35BXX0 Maternal care for other (suspected) fetal abnormality and damage, fetal cardiac anomalies, not applicable or unspecified: Secondary | ICD-10-CM

## 2015-06-08 DIAGNOSIS — O358XX0 Maternal care for other (suspected) fetal abnormality and damage, not applicable or unspecified: Secondary | ICD-10-CM | POA: Diagnosis not present

## 2015-06-08 DIAGNOSIS — Z3482 Encounter for supervision of other normal pregnancy, second trimester: Secondary | ICD-10-CM

## 2015-06-08 LAB — POCT URINALYSIS DIPSTICK
Bilirubin, UA: NEGATIVE
Blood, UA: NEGATIVE
GLUCOSE UA: 100
KETONES UA: NEGATIVE
LEUKOCYTES UA: NEGATIVE
Nitrite, UA: NEGATIVE
PROTEIN UA: NEGATIVE
SPEC GRAV UA: 1.02
Urobilinogen, UA: NEGATIVE
pH, UA: 6

## 2015-06-08 NOTE — Patient Instructions (Signed)
Second Trimester of Pregnancy The second trimester is from week 13 through week 28, months 4 through 6. The second trimester is often a time when you feel your best. Your body has also adjusted to being pregnant, and you begin to feel better physically. Usually, morning sickness has lessened or quit completely, you may have more energy, and you may have an increase in appetite. The second trimester is also a time when the fetus is growing rapidly. At the end of the sixth month, the fetus is about 9 inches long and weighs about 1 pounds. You will likely begin to feel the baby move (quickening) between 18 and 20 weeks of the pregnancy. BODY CHANGES Your body goes through many changes during pregnancy. The changes vary from woman to woman.   Your weight will continue to increase. You will notice your lower abdomen bulging out.  You may begin to get stretch marks on your hips, abdomen, and breasts.  You may develop headaches that can be relieved by medicines approved by your health care provider.  You may urinate more often because the fetus is pressing on your bladder.  You may develop or continue to have heartburn as a result of your pregnancy.  You may develop constipation because certain hormones are causing the muscles that push waste through your intestines to slow down.  You may develop hemorrhoids or swollen, bulging veins (varicose veins).  You may have back pain because of the weight gain and pregnancy hormones relaxing your joints between the bones in your pelvis and as a result of a shift in weight and the muscles that support your balance.  Your breasts will continue to grow and be tender.  Your gums may bleed and may be sensitive to brushing and flossing.  Dark spots or blotches (chloasma, mask of pregnancy) may develop on your face. This will likely fade after the baby is born.  A dark line from your belly button to the pubic area (linea nigra) may appear. This will likely fade  after the baby is born.  You may have changes in your hair. These can include thickening of your hair, rapid growth, and changes in texture. Some women also have hair loss during or after pregnancy, or hair that feels dry or thin. Your hair will most likely return to normal after your baby is born. WHAT TO EXPECT AT YOUR PRENATAL VISITS During a routine prenatal visit:  You will be weighed to make sure you and the fetus are growing normally.  Your blood pressure will be taken.  Your abdomen will be measured to track your baby's growth.  The fetal heartbeat will be listened to.  Any test results from the previous visit will be discussed. Your health care provider may ask you:  How you are feeling.  If you are feeling the baby move.  If you have had any abnormal symptoms, such as leaking fluid, bleeding, severe headaches, or abdominal cramping.  If you have any questions. Other tests that may be performed during your second trimester include:  Blood tests that check for:  Low iron levels (anemia).  Gestational diabetes (between 24 and 28 weeks).  Rh antibodies.  Urine tests to check for infections, diabetes, or protein in the urine.  An ultrasound to confirm the proper growth and development of the baby.  An amniocentesis to check for possible genetic problems.  Fetal screens for spina bifida and Down syndrome. HOME CARE INSTRUCTIONS   Avoid all smoking, herbs, alcohol, and unprescribed   drugs. These chemicals affect the formation and growth of the baby.  Follow your health care provider's instructions regarding medicine use. There are medicines that are either safe or unsafe to take during pregnancy.  Exercise only as directed by your health care provider. Experiencing uterine cramps is a good sign to stop exercising.  Continue to eat regular, healthy meals.  Wear a good support bra for breast tenderness.  Do not use hot tubs, steam rooms, or saunas.  Wear your  seat belt at all times when driving.  Avoid raw meat, uncooked cheese, cat litter boxes, and soil used by cats. These carry germs that can cause birth defects in the baby.  Take your prenatal vitamins.  Try taking a stool softener (if your health care provider approves) if you develop constipation. Eat more high-fiber foods, such as fresh vegetables or fruit and whole grains. Drink plenty of fluids to keep your urine clear or pale yellow.  Take warm sitz baths to soothe any pain or discomfort caused by hemorrhoids. Use hemorrhoid cream if your health care provider approves.  If you develop varicose veins, wear support hose. Elevate your feet for 15 minutes, 3-4 times a day. Limit salt in your diet.  Avoid heavy lifting, wear low heel shoes, and practice good posture.  Rest with your legs elevated if you have leg cramps or low back pain.  Visit your dentist if you have not gone yet during your pregnancy. Use a soft toothbrush to brush your teeth and be gentle when you floss.  A sexual relationship may be continued unless your health care provider directs you otherwise.  Continue to go to all your prenatal visits as directed by your health care provider. SEEK MEDICAL CARE IF:   You have dizziness.  You have mild pelvic cramps, pelvic pressure, or nagging pain in the abdominal area.  You have persistent nausea, vomiting, or diarrhea.  You have a bad smelling vaginal discharge.  You have pain with urination. SEEK IMMEDIATE MEDICAL CARE IF:   You have a fever.  You are leaking fluid from your vagina.  You have spotting or bleeding from your vagina.  You have severe abdominal cramping or pain.  You have rapid weight gain or loss.  You have shortness of breath with chest pain.  You notice sudden or extreme swelling of your face, hands, ankles, feet, or legs.  You have not felt your baby move in over an hour.  You have severe headaches that do not go away with  medicine.  You have vision changes. Document Released: 10/03/2001 Document Revised: 10/14/2013 Document Reviewed: 12/10/2012 [employer] Patient Information 2015 [employer]. This information is not intended to replace advice given to you by your health care provider. Make sure you discuss any questions you have with your health care provider.  

## 2015-06-08 NOTE — Addendum Note (Signed)
Addended by: Clearnce Hasten on: 06/08/2015 04:22 PM   Modules accepted: Orders

## 2015-06-08 NOTE — Progress Notes (Signed)
Subjective:    Megan [name] is a 19 y.o. female being seen today for her obstetrical visit. She is at [redacted]w[redacted]d gestation. Patient reports: no complaints . Fetal movement: normal.  Problem List Items Addressed This Visit    None     Patient Active Problem List   Diagnosis Date Noted  . Abdominal pain, left lower quadrant 03/08/2013  . Migraine with status migrainosus 03/07/2013   Objective:    BP 130/80 mmHg  Pulse 92  Temp(Src) 98.3 F (36.8 C)  Wt 185 lb (83.915 kg)  LMP 01/18/2015 FHT: 157 BPM  Uterine Size: size equals dates     Assessment:    Pregnancy @ [redacted]w[redacted]d    Doing well  Plan:    OBGCT: discussed and ordered for next visit. Signs and symptoms of preterm labor: discussed. [facility] fetal [facility] and left echogenic focus on fetal anatomy scan in office Labs, problem list reviewed and updated 2 hr GTT planned Follow up in 4 weeks with OGTT.

## 2015-06-14 ENCOUNTER — Telehealth: Payer: Self-pay | Admitting: *Deleted

## 2015-06-14 NOTE — Telephone Encounter (Signed)
Patient contacted the office stating she has been having stomach pains all day. Patient states it hurts to use the bathroom.  Attempted to contact the patient at two different numbers. Left a message at one number and the second number is unavailable.

## 2015-06-14 NOTE — Telephone Encounter (Signed)
Patient contacted the office. Returned patient's call and left message advising to seek attention at [facility]

## 2015-06-18 ENCOUNTER — Encounter (HOSPITAL_COMMUNITY): Payer: Self-pay | Admitting: Emergency Medicine

## 2015-06-18 DIAGNOSIS — Z79899 Other long term (current) drug therapy: Secondary | ICD-10-CM | POA: Diagnosis not present

## 2015-06-18 DIAGNOSIS — O9989 Other specified diseases and conditions complicating pregnancy, childbirth and the puerperium: Secondary | ICD-10-CM | POA: Diagnosis present

## 2015-06-18 DIAGNOSIS — O23512 Infections of cervix in pregnancy, second trimester: Secondary | ICD-10-CM | POA: Insufficient documentation

## 2015-06-18 DIAGNOSIS — B373 Candidiasis of vulva and vagina: Secondary | ICD-10-CM | POA: Diagnosis not present

## 2015-06-18 DIAGNOSIS — Z8719 Personal history of other diseases of the digestive system: Secondary | ICD-10-CM | POA: Insufficient documentation

## 2015-06-18 DIAGNOSIS — Z3A23 23 weeks gestation of pregnancy: Secondary | ICD-10-CM | POA: Insufficient documentation

## 2015-06-18 DIAGNOSIS — Z8679 Personal history of other diseases of the circulatory system: Secondary | ICD-10-CM | POA: Diagnosis not present

## 2015-06-18 DIAGNOSIS — R102 Pelvic and perineal pain: Secondary | ICD-10-CM | POA: Insufficient documentation

## 2015-06-18 DIAGNOSIS — O98812 Other maternal infectious and parasitic diseases complicating pregnancy, second trimester: Secondary | ICD-10-CM | POA: Insufficient documentation

## 2015-06-18 LAB — COMPREHENSIVE METABOLIC PANEL
ALT: 16 U/L (ref 14–54)
ANION GAP: 8 (ref 5–15)
AST: 20 U/L (ref 15–41)
Albumin: 2.8 g/dL — ABNORMAL LOW (ref 3.5–5.0)
Alkaline Phosphatase: 67 U/L (ref 38–126)
BUN: <5 mg/dL — ABNORMAL LOW (ref 6–20)
CALCIUM: 8.6 mg/dL — AB (ref 8.9–10.3)
CHLORIDE: 105 mmol/L (ref 101–111)
CO2: 21 mmol/L — ABNORMAL LOW (ref 22–32)
CREATININE: 0.66 mg/dL (ref 0.44–1.00)
Glucose, Bld: 104 mg/dL — ABNORMAL HIGH (ref 65–99)
Potassium: 3.7 mmol/L (ref 3.5–5.1)
Sodium: 134 mmol/L — ABNORMAL LOW (ref 135–145)
Total Bilirubin: 0.1 mg/dL — ABNORMAL LOW (ref 0.3–1.2)
Total Protein: 6.2 g/dL — ABNORMAL LOW (ref 6.5–8.1)

## 2015-06-18 LAB — CBC
HCT: 33.7 % — ABNORMAL LOW (ref 36.0–46.0)
Hemoglobin: 11.5 g/dL — ABNORMAL LOW (ref 12.0–15.0)
MCH: 31.3 pg (ref 26.0–34.0)
MCHC: 34.1 g/dL (ref 30.0–36.0)
MCV: 91.6 fL (ref 78.0–100.0)
PLATELETS: 279 10*3/uL (ref 150–400)
RBC: 3.68 MIL/uL — AB (ref 3.87–5.11)
RDW: 13.4 % (ref 11.5–15.5)
WBC: 11.2 10*3/uL — AB (ref 4.0–10.5)

## 2015-06-18 NOTE — ED Notes (Signed)
Patient here with complaint of pelvic pain (primarily on the right), dysuria and polyuria, and vaginal discharge. Patient is pregnant; 23 wks; EDD 10/14/2015. States that today she began to see some spotting, but no gross bleeding.

## 2015-06-19 ENCOUNTER — Emergency Department (HOSPITAL_COMMUNITY)
Admission: EM | Admit: 2015-06-19 | Discharge: 2015-06-19 | Disposition: A | Discharge status: Home / Self Care | Payer: Medicaid Other | Attending: Emergency Medicine | Admitting: Emergency Medicine

## 2015-06-19 DIAGNOSIS — B3731 Acute candidiasis of vulva and vagina: Secondary | ICD-10-CM

## 2015-06-19 DIAGNOSIS — N72 Inflammatory disease of cervix uteri: Secondary | ICD-10-CM

## 2015-06-19 DIAGNOSIS — B373 Candidiasis of vulva and vagina: Secondary | ICD-10-CM

## 2015-06-19 LAB — URINE MICROSCOPIC-ADD ON

## 2015-06-19 LAB — URINALYSIS, ROUTINE W REFLEX MICROSCOPIC
BILIRUBIN URINE: NEGATIVE
Glucose, UA: 100 mg/dL — AB
HGB URINE DIPSTICK: NEGATIVE
Ketones, ur: NEGATIVE mg/dL
Nitrite: NEGATIVE
PROTEIN: NEGATIVE mg/dL
Specific Gravity, Urine: 1.028 (ref 1.005–1.030)
UROBILINOGEN UA: 1 mg/dL (ref 0.0–1.0)
pH: 6 (ref 5.0–8.0)

## 2015-06-19 LAB — WET PREP, GENITAL
Clue Cells Wet Prep HPF POC: NONE SEEN
Trich, Wet Prep: NONE SEEN
YEAST WET PREP: NONE SEEN

## 2015-06-19 MED ORDER — LIDOCAINE HCL (PF) 1 % IJ SOLN
2.0000 mL | Freq: Once | INTRAMUSCULAR | Status: AC
  Administered 2015-06-19: 2 mL
  Filled 2015-06-19: qty 2

## 2015-06-19 MED ORDER — CEFTRIAXONE SODIUM 250 MG IJ SOLR
250.0000 mg | Freq: Once | INTRAMUSCULAR | Status: AC
  Administered 2015-06-19: 250 mg via INTRAMUSCULAR
  Filled 2015-06-19: qty 250

## 2015-06-19 MED ORDER — AZITHROMYCIN 250 MG PO TABS
1000.0000 mg | ORAL_TABLET | Freq: Once | ORAL | Status: AC
  Administered 2015-06-19: 1000 mg via ORAL
  Filled 2015-06-19: qty 4

## 2015-06-19 MED ORDER — FLUCONAZOLE 100 MG PO TABS: 150.0000 mg | ORAL_TABLET | Freq: Once | ORAL | Status: DC

## 2015-06-19 MED ORDER — FLUCONAZOLE 100 MG PO TABS
150.0000 mg | ORAL_TABLET | Freq: Once | ORAL | Status: AC
  Administered 2015-06-19: 150 mg via ORAL
  Filled 2015-06-19: qty 2

## 2015-06-19 NOTE — ED Provider Notes (Signed)
CSN: [id number]     Arrival date & time 06/18/15  2222 History  This chart was scribed for Tomasita Crumble, MD by Phillis Haggis, ED Scribe. This patient was seen in room A09C/A09C and patient care was started at 2:12 AM.     Chief Complaint  Patient presents with  . Pelvic Pain  . Dysuria   The history is provided by the patient. No language interpreter was used.  HPI Comments: Megan [name] is a 19 y.o. Female with hx of UTI and is [redacted] wks pregnant who presents to the Emergency Department complaining of right sided abdominal pain, polyuria, dysuria, vaginal discharge and vaginal spotting. Pt states that her vagina feels swollen and itching. She denies fever, nausea, and vomiting. Has hx of UTI. States that she has had an ultrasound for the pregnancy and there have been no complications so far.    Past Medical History  Diagnosis Date  . Migraines   . GERD (gastroesophageal reflux disease)   . Gallstones    Past Surgical History  Procedure Laterality Date  . Tumor removal      L foot   Family History  Problem Relation Age of Onset  . Heart disease Father   . Hypertension Father   . Diabetes Father   . Stroke Father   . Depression Father    Social History  Substance Use Topics  . Smoking status: Never Smoker   . Smokeless tobacco: None  . Alcohol Use: No   OB History    Gravida Para Term Preterm AB TAB SAB Ectopic Multiple Living   1              Review of Systems 10 Systems reviewed and all are negative for acute change except as noted in the HPI.  Allergies  Review of patient's allergies indicates no known allergies.  Home Medications   Prior to Admission medications   Medication Sig Start Date End Date Taking? Authorizing Provider  acetaminophen (TYLENOL) 500 MG tablet Take 500 mg by mouth every 6 (six) hours as needed for headache.    Historical Provider, MD  butalbital-acetaminophen-caffeine (FIORICET) [phone] MG per tablet Take 1-2 tablets by mouth every 6  (six) hours as needed for headache. 04/02/15 04/01/16  Rachelle A Denney, CNM  Doxylamine-Pyridoxine (DICLEGIS) 10-10 MG TBEC Take 2 tablets by mouth at bedtime. 04/02/15   Rachelle A Denney, CNM  flintstones complete (FLINTSTONES) 60 MG chewable tablet Chew 1 tablet by mouth daily.    Historical Provider, MD  ondansetron (ZOFRAN-ODT) 4 MG disintegrating tablet Take 1 tablet (4 mg total) by mouth every 8 (eight) hours as needed for nausea or vomiting. Patient not taking: Reported on 04/02/2015 02/23/15   Loren Racer, MD  Prenat-Fe Poly-Methfol-FA-DHA (VITAFOL ULTRA) 29-0.6-0.4-200 MG CAPS Take 1 tablet by mouth daily. 04/02/15   Rachelle A Denney, CNM   BP 117/68 mmHg  Pulse 78  Temp(Src) 98.5 F (36.9 C) (Oral)  Resp 16  Ht 5\' 2"  (1.575 m)  Wt 185 lb (83.915 kg)  BMI 33.83 kg/m2  SpO2 100%  LMP 01/18/2015  Physical Exam  Constitutional: She is oriented to person, place, and time. She appears well-developed and well-nourished. No distress.  HENT:  Head: Normocephalic and atraumatic.  Nose: Nose normal.  Mouth/Throat: Oropharynx is clear and moist. No oropharyngeal exudate.  Eyes: Conjunctivae and EOM are normal. Pupils are equal, round, and reactive to light. No scleral icterus.  Neck: Normal range of motion. Neck supple. No JVD present. No  tracheal deviation present. No thyromegaly present.  Cardiovascular: Normal rate, regular rhythm and normal heart sounds.  Exam reveals no gallop and no friction rub.   No murmur heard. Pulmonary/Chest: Effort normal and breath sounds normal. No respiratory distress. She has no wheezes. She exhibits no tenderness.  Abdominal: Soft. Bowel sounds are normal. She exhibits no distension and no mass. There is no tenderness. There is no rebound and no guarding.  Genitourinary: Vaginal discharge found.  Fungal discharge present, also purulence coming from cervical os present.  No CMT or adnexal tenderness.  Musculoskeletal: Normal range of motion. She  exhibits no edema or tenderness.  Lymphadenopathy:    She has no cervical adenopathy.  Neurological: She is alert and oriented to person, place, and time. No cranial nerve deficit. She exhibits normal muscle tone.  Skin: Skin is warm and dry. No rash noted. No erythema. No pallor.  Nursing note and vitals reviewed.   ED Course  Procedures (including critical care time) DIAGNOSTIC STUDIES: Oxygen Saturation is 100% on RA, normal by my interpretation.    COORDINATION OF CARE: 2:14 AM-Discussed treatment plan which includes pelvic exam with pt at bedside and pt agreed to plan.   Labs Review Labs Reviewed  WET PREP, GENITAL - Abnormal; Notable for the following:    WBC, Wet Prep HPF POC MODERATE (*)    All other components within normal limits  COMPREHENSIVE METABOLIC PANEL - Abnormal; Notable for the following:    Sodium 134 (*)    CO2 21 (*)    Glucose, Bld 104 (*)    BUN <5 (*)    Calcium 8.6 (*)    Total Protein 6.2 (*)    Albumin 2.8 (*)    Total Bilirubin 0.1 (*)    All other components within normal limits  CBC - Abnormal; Notable for the following:    WBC 11.2 (*)    RBC 3.68 (*)    Hemoglobin 11.5 (*)    HCT 33.7 (*)    All other components within normal limits  URINALYSIS, ROUTINE W REFLEX MICROSCOPIC (NOT AT [facility]) - Abnormal; Notable for the following:    APPearance CLOUDY (*)    Glucose, UA 100 (*)    Leukocytes, UA SMALL (*)    All other components within normal limits  URINE MICROSCOPIC-ADD ON - Abnormal; Notable for the following:    Squamous Epithelial / LPF MANY (*)    Bacteria, UA MANY (*)    All other components within normal limits  HCG, QUANTITATIVE, PREGNANCY  GC/CHLAMYDIA PROBE AMP ([place]) NOT AT [facility]    Imaging Review No results found.    EKG Interpretation None      MDM   Final diagnoses:  None   Patient presents emergency department for lower abdominal pain, dysuria and polyuria. She also has vaginal discharge. I plan to  perform pelvic exam. She is alert and had a normal ultrasound showing a live IUP. Urinalysis here is positive for bacteria, pelvic exam is pending.  Pelvic exam is consistent with cervicitis.  Will also treat for yeast infection.  One time diflucan dose is preg class C.  She received azithromycin and ceftriaxone as well.  Follow up encouraged.  She appears well and in NAD.  Her VS remain within her normal limits and she is safe for DC.  I personally performed the services described in this documentation, which was scribed in my presence. The recorded information has been reviewed and is accurate.    Mindee Robledo  Mora Bellman, MD 06/19/15 [phone]

## 2015-06-19 NOTE — Discharge Instructions (Signed)
Candidal Vulvovaginitis Megan [name], you were treated for yeast infection and for STDs.  See your ob/gyn within 3 days for close follow up.  If symptoms worsen, come back to the ED immediately. Thank you. Candidal vulvovaginitis is an infection of the vagina and vulva. The vulva is the skin around the opening of the vagina. This may cause itching and discomfort in and around the vagina.  HOME CARE  Only take medicine as told by your doctor.  Do not have sex (intercourse) until the infection is healed or as told by your doctor.  Practice safe sex.  Tell your sex partner about your infection.  Do not douche or use tampons.  Wear cotton underwear. Do not wear tight pants or panty hose.  Eat yogurt. This may help treat and prevent yeast infections. GET HELP RIGHT AWAY IF:   You have a fever.  Your problems get worse during treatment or do not get better in 3 days.  You have discomfort, irritation, or itching in your vagina or vulva area.  You have pain after sex.  You start to get belly (abdominal) pain. MAKE SURE YOU:  Understand these instructions.  Will watch your condition.  Will get help right away if you are not doing well or get worse. Document Released: 01/05/2009 Document Revised: 10/14/2013 Document Reviewed: 01/05/2009 [facility] Patient Information 2015 [place], [place]. This information is not intended to replace advice given to you by your health care provider. Make sure you discuss any questions you have with your health care provider.   Sexually Transmitted Disease A sexually transmitted disease (STD) is a disease or infection often passed to another person during sex. However, STDs can be passed through nonsexual ways. An STD can be passed through:  Spit (saliva).  Semen.  Blood.  Mucus from the vagina.  Pee (urine). HOW CAN I LESSEN MY CHANCES OF GETTING AN STD?  Use:  Latex condoms.  Water-soluble lubricants with condoms. Do not use petroleum  jelly or oils.  Dental dams. These are small pieces of latex that are used as a barrier during oral sex.  Avoid having more than one sex partner.  Do not have sex with someone who has other sex partners.  Do not have sex with anyone you do not know or who is at high risk for an STD.  Avoid risky sex that can break your skin.  Do not have sex if you have open sores on your mouth or skin.  Avoid drinking too much alcohol or taking illegal drugs. Alcohol and drugs can affect your good judgment.  Avoid oral and anal sex acts.  Get shots (vaccines) for HPV and hepatitis.  If you are at risk of being infected with HIV, it is advised that you take a certain medicine daily to prevent HIV infection. This is called pre-exposure prophylaxis (PrEP). You may be at risk if:  You are a man who has sex with other men (MSM).  You are attracted to the opposite sex (heterosexual) and are having sex with more than one partner.  You take drugs with a needle.  You have sex with someone who has HIV.  Talk with your doctor about if you are at high risk of being infected with HIV. If you begin to take PrEP, get tested for HIV first. Get tested every 3 months for as long as you are taking PrEP. WHAT SHOULD I DO IF I THINK I HAVE AN STD?  See your doctor.  Tell your sex  partner(s) that you have an STD. They should be tested and treated.  Do not have sex until your doctor says it is okay. WHEN SHOULD I GET HELP? Get help right away if:  You have bad belly (abdominal) pain.  You are a man and have puffiness (swelling) or pain in your testicles.  You are a woman and have puffiness in your vagina. Document Released: 11/16/2004 Document Revised: 10/14/2013 Document Reviewed: 04/04/2013 [facility] Patient Information 2015 [place], [place]. This information is not intended to replace advice given to you by your health care provider. Make sure you discuss any questions you have with your health care  provider.

## 2015-06-21 LAB — GC/CHLAMYDIA PROBE AMP (~~LOC~~) NOT AT ARMC
CHLAMYDIA, DNA PROBE: NEGATIVE
NEISSERIA GONORRHEA: NEGATIVE

## 2015-06-22 ENCOUNTER — Ambulatory Visit (INDEPENDENT_AMBULATORY_CARE_PROVIDER_SITE_OTHER): Payer: Medicaid Other | Admitting: Certified Nurse Midwife

## 2015-06-22 VITALS — BP 122/73 | HR 79 | Temp 98.5°F | Wt 184.2 lb

## 2015-06-22 DIAGNOSIS — B3731 Acute candidiasis of vulva and vagina: Secondary | ICD-10-CM

## 2015-06-22 DIAGNOSIS — B373 Candidiasis of vulva and vagina: Secondary | ICD-10-CM

## 2015-06-22 DIAGNOSIS — O2342 Unspecified infection of urinary tract in pregnancy, second trimester: Secondary | ICD-10-CM

## 2015-06-22 LAB — POCT URINALYSIS DIPSTICK
Bilirubin, UA: NEGATIVE
Blood, UA: NEGATIVE
Glucose, UA: NORMAL
Ketones, UA: NEGATIVE
Nitrite, UA: NEGATIVE
PH UA: 7
PROTEIN UA: NEGATIVE
SPEC GRAV UA: 1.01
UROBILINOGEN UA: NEGATIVE

## 2015-06-22 MED ORDER — TERCONAZOLE 0.4 % VA CREA: 1.0000 | TOPICAL_CREAM | Freq: Every day | VAGINAL | Status: DC

## 2015-06-22 MED ORDER — FLUCONAZOLE 100 MG PO TABS: 100.0000 mg | ORAL_TABLET | Freq: Once | ORAL | Status: DC

## 2015-06-22 NOTE — Progress Notes (Signed)
Subjective:    Megan [name] is a 19 y.o. female being seen today for her obstetrical visit. She is at [redacted]w[redacted]d gestation. Patient reports: no bleeding, no contractions, no cramping, no leaking, vaginal irritation and vaginal itching with discharge, denies any odor.  Has been having lower abdominal pain that is sharp stabbing on lower abdomen.  Encouraged slow position changes, pillows, soaking in bathtub . Fetal movement: normal.  Problem List Items Addressed This Visit    None    Visit Diagnoses    Urinary tract infection affecting care of mother, antepartum, second trimester    -  Primary    Relevant Medications    fluconazole (DIFLUCAN) 100 MG tablet    Other Relevant Orders    POCT urinalysis dipstick (Completed)    Vulvovaginal candidiasis        Relevant Medications    fluconazole (DIFLUCAN) 100 MG tablet    terconazole (TERAZOL 7) 0.4 % vaginal cream      Patient Active Problem List   Diagnosis Date Noted  . Abdominal pain, left lower quadrant 03/08/2013  . Migraine with status migrainosus 03/07/2013   Objective:    BP 122/73 mmHg  Pulse 79  Temp(Src) 98.5 F (36.9 C)  Wt 184 lb 3.2 oz (83.553 kg)  LMP 01/18/2015 FHT: 155 BPM  Uterine Size: size equals dates     Assessment:    Pregnancy @ [redacted]w[redacted]d    Round ligament pain VVC  Plan:    OBGCT: discussed and ordered for next visit. Signs and symptoms of preterm labor: discussed. Rx given abdominal maternity support belt Labs, problem list reviewed and updated 2 hr GTT planned Follow up in 3 weeks.

## 2015-06-22 NOTE — Addendum Note (Signed)
Addended by: Marya Landry D on: 06/22/2015 04:04 PM   Modules accepted: Orders

## 2015-06-25 ENCOUNTER — Other Ambulatory Visit: Payer: Self-pay | Admitting: Certified Nurse Midwife

## 2015-06-25 LAB — SURESWAB, VAGINOSIS/VAGINITIS PLUS
Atopobium vaginae: NOT DETECTED Log (cells/mL)
C. GLABRATA, DNA: NOT DETECTED
C. TRACHOMATIS RNA, TMA: NOT DETECTED
C. TROPICALIS, DNA: NOT DETECTED
C. albicans, DNA: DETECTED — AB
C. parapsilosis, DNA: NOT DETECTED
GARDNERELLA VAGINALIS: NOT DETECTED Log (cells/mL)
LACTOBACILLUS SPECIES: 7.7 Log (cells/mL)
MEGASPHAERA SPECIES: NOT DETECTED Log (cells/mL)
N. gonorrhoeae RNA, TMA: NOT DETECTED
T. VAGINALIS RNA, QL TMA: NOT DETECTED

## 2015-07-06 ENCOUNTER — Ambulatory Visit (INDEPENDENT_AMBULATORY_CARE_PROVIDER_SITE_OTHER): Payer: Medicaid Other | Admitting: Certified Nurse Midwife

## 2015-07-06 VITALS — BP 128/77 | HR 94 | Temp 97.3°F | Wt 187.0 lb

## 2015-07-06 DIAGNOSIS — Z3402 Encounter for supervision of normal first pregnancy, second trimester: Secondary | ICD-10-CM

## 2015-07-06 LAB — POCT URINALYSIS DIPSTICK
BILIRUBIN UA: NEGATIVE
Blood, UA: NEGATIVE
Glucose, UA: 50
Ketones, UA: NEGATIVE
Nitrite, UA: NEGATIVE
PH UA: 8
Protein, UA: NEGATIVE
Spec Grav, UA: <=1.005
UROBILINOGEN UA: NEGATIVE

## 2015-07-06 NOTE — Progress Notes (Signed)
Subjective:    Megan [name] is a 19 y.o. female being seen today for her obstetrical visit. She is at [redacted]w[redacted]d gestation. Patient reports: no complaints . Fetal movement: normal.  Ate cereal before appointment that was high in sugar.    Problem List Items Addressed This Visit    None    Visit Diagnoses    Encounter for supervision of normal first pregnancy in second trimester    -  Primary    Relevant Orders    POCT urinalysis dipstick (Completed)      Patient Active Problem List   Diagnosis Date Noted  . Abdominal pain, left lower quadrant 03/08/2013  . Migraine with status migrainosus 03/07/2013   Objective:    BP 128/77 mmHg  Pulse 94  Temp(Src) 97.3 F (36.3 C)  Wt 187 lb (84.823 kg)  LMP 01/18/2015 FHT: 155 BPM  Uterine Size: size equals dates     Assessment:    Pregnancy @ [redacted]w[redacted]d    Doing well.   Plan:    OBGCT: ordered for next visit. Signs and symptoms of preterm labor: discussed.  Labs, problem list reviewed and updated 2 hr GTT planned for next ROB visit.  Follow up in 2 weeks.

## 2015-07-20 ENCOUNTER — Other Ambulatory Visit: Payer: Medicaid Other

## 2015-07-20 ENCOUNTER — Ambulatory Visit (INDEPENDENT_AMBULATORY_CARE_PROVIDER_SITE_OTHER): Payer: Medicaid Other | Admitting: Certified Nurse Midwife

## 2015-07-20 VITALS — BP 130/79 | HR 91 | Temp 98.2°F | Wt 190.0 lb

## 2015-07-20 DIAGNOSIS — Z3402 Encounter for supervision of normal first pregnancy, second trimester: Secondary | ICD-10-CM

## 2015-07-20 LAB — RPR

## 2015-07-20 NOTE — Addendum Note (Signed)
Addended by: Marya Landry D on: 07/20/2015 11:08 AM   Modules accepted: Orders

## 2015-07-20 NOTE — Progress Notes (Signed)
Subjective:    Megan [name] is a 19 y.o. female being seen today for her obstetrical visit. She is at [redacted]w[redacted]d gestation. Patient reports: no complaints . Fetal movement: normal.  Problem List Items Addressed This Visit    None    Visit Diagnoses    Encounter for supervision of normal first pregnancy in second trimester    -  Primary    Relevant Orders    POCT urinalysis dipstick    [place] [facility] OB FOLLOW UP      Patient Active Problem List   Diagnosis Date Noted  . Abdominal pain, left lower quadrant 03/08/2013  . Migraine with status migrainosus 03/07/2013   Objective:    BP 130/79 mmHg  Pulse 91  Temp(Src) 98.2 F (36.8 C)  Wt 190 lb (86.183 kg)  LMP 01/18/2015 FHT: 144 BPM  Uterine Size: size equals dates     Assessment:    Pregnancy @ [redacted]w[redacted]d    Doing well. Plan:    OBGCT: ordered. Signs and symptoms of preterm labor: discussed.  Labs, problem list reviewed and updated 2 hr GTT today.  Follow up in 2 weeks.

## 2015-07-20 NOTE — Addendum Note (Signed)
Addended by: Marya Landry D on: 07/20/2015 04:32 PM   Modules accepted: Orders

## 2015-07-21 LAB — GLUCOSE TOLERANCE, 2 HOURS W/ 1HR
GLUCOSE, FASTING: 76 mg/dL (ref 65–99)
Glucose, 1 hour: 110 mg/dL (ref 70–170)
Glucose, 2 hour: 104 mg/dL (ref 70–139)

## 2015-07-21 LAB — HIV ANTIBODY (ROUTINE TESTING W REFLEX): HIV 1&2 Ab, 4th Generation: NONREACTIVE

## 2015-08-04 ENCOUNTER — Ambulatory Visit (INDEPENDENT_AMBULATORY_CARE_PROVIDER_SITE_OTHER): Payer: Medicaid Other | Admitting: Certified Nurse Midwife

## 2015-08-04 ENCOUNTER — Other Ambulatory Visit: Payer: Self-pay | Admitting: Certified Nurse Midwife

## 2015-08-04 ENCOUNTER — Ambulatory Visit (HOSPITAL_COMMUNITY)
Admission: RE | Admit: 2015-08-04 | Discharge: 2015-08-04 | Disposition: A | Discharge status: Home / Self Care | Payer: Medicaid Other | Source: Ambulatory Visit | Attending: Certified Nurse Midwife | Admitting: Certified Nurse Midwife

## 2015-08-04 VITALS — BP 120/72 | HR 80 | Wt 190.0 lb

## 2015-08-04 DIAGNOSIS — G43919 Migraine, unspecified, intractable, without status migrainosus: Secondary | ICD-10-CM

## 2015-08-04 DIAGNOSIS — O358XX0 Maternal care for other (suspected) fetal abnormality and damage, not applicable or unspecified: Secondary | ICD-10-CM

## 2015-08-04 DIAGNOSIS — Z3403 Encounter for supervision of normal first pregnancy, third trimester: Secondary | ICD-10-CM

## 2015-08-04 DIAGNOSIS — Z0489 Encounter for examination and observation for other specified reasons: Secondary | ICD-10-CM

## 2015-08-04 DIAGNOSIS — O35BXX0 Maternal care for other (suspected) fetal abnormality and damage, fetal cardiac anomalies, not applicable or unspecified: Secondary | ICD-10-CM

## 2015-08-04 DIAGNOSIS — Z3A29 29 weeks gestation of pregnancy: Secondary | ICD-10-CM

## 2015-08-04 DIAGNOSIS — IMO0002 Reserved for concepts with insufficient information to code with codable children: Secondary | ICD-10-CM

## 2015-08-04 DIAGNOSIS — Z36 Encounter for antenatal screening of mother: Secondary | ICD-10-CM | POA: Diagnosis not present

## 2015-08-04 DIAGNOSIS — Z3402 Encounter for supervision of normal first pregnancy, second trimester: Secondary | ICD-10-CM

## 2015-08-04 LAB — POCT URINALYSIS DIPSTICK
Bilirubin, UA: NEGATIVE
Blood, UA: NEGATIVE
Glucose, UA: NEGATIVE
KETONES UA: NEGATIVE
NITRITE UA: NEGATIVE
PH UA: 6
Spec Grav, UA: 1.015
UROBILINOGEN UA: NEGATIVE

## 2015-08-04 MED ORDER — BUTALBITAL-APAP-CAFFEINE 50-325-40 MG PO TABS: 1.0000 | ORAL_TABLET | Freq: Four times a day (QID) | ORAL | Status: DC | PRN

## 2015-08-05 NOTE — Progress Notes (Signed)
Subjective:    Megan [name] is a 19 y.o. female being seen today for her obstetrical visit. She is at 7234w0d gestation. Patient reports headache, no bleeding, no contractions, no cramping and no leaking. Fetal movement: normal.  Problem List Items Addressed This Visit    None    Visit Diagnoses    Encounter for supervision of normal first pregnancy in third trimester    -  Primary    Relevant Orders    POCT urinalysis dipstick (Completed)    Intractable migraine without status migrainosus, unspecified migraine type        Relevant Medications    butalbital-acetaminophen-caffeine (FIORICET) 50-325-40 MG tablet      Patient Active Problem List   Diagnosis Date Noted  . Abdominal pain, left lower quadrant 03/08/2013  . Migraine with status migrainosus 03/07/2013   Objective:    BP 120/72 mmHg  Pulse 80  Wt 190 lb (86.183 kg)  LMP 01/18/2015 FHT:  145 BPM  Uterine Size: 33 cm and size greater than dates  Presentation: cephalic     Assessment:    Pregnancy @ 6534w0d weeks   Plan:     labs reviewed, problem list updated Consent signed. GBS sent TDAP offered  Rhogam given for RH negative Pediatrician: discussed. Infant feeding: plans to breastfeed. Maternity leave: discussed. Cigarette smoking: never smoked. Orders Placed This Encounter  Procedures  . POCT urinalysis dipstick   Meds ordered this encounter  Medications  . butalbital-acetaminophen-caffeine (FIORICET) 50-325-40 MG tablet    Sig: Take 1-2 tablets by mouth every 6 (six) hours as needed for headache.    Dispense:  40 tablet    Refill:  3   Follow up in 2 Weeks.

## 2015-08-11 ENCOUNTER — Other Ambulatory Visit: Payer: Self-pay | Admitting: Certified Nurse Midwife

## 2015-08-18 ENCOUNTER — Ambulatory Visit (INDEPENDENT_AMBULATORY_CARE_PROVIDER_SITE_OTHER): Payer: Medicaid Other | Admitting: Certified Nurse Midwife

## 2015-08-18 VITALS — BP 118/71 | HR 99 | Temp 98.2°F | Wt 192.0 lb

## 2015-08-18 DIAGNOSIS — Z3403 Encounter for supervision of normal first pregnancy, third trimester: Secondary | ICD-10-CM

## 2015-08-18 DIAGNOSIS — K5901 Slow transit constipation: Secondary | ICD-10-CM

## 2015-08-18 LAB — POCT URINALYSIS DIPSTICK
BILIRUBIN UA: NEGATIVE
Blood, UA: NEGATIVE
GLUCOSE UA: 100
KETONES UA: NEGATIVE
LEUKOCYTES UA: NEGATIVE
Nitrite, UA: NEGATIVE
Protein, UA: NEGATIVE
SPEC GRAV UA: 1.01
Urobilinogen, UA: NEGATIVE
pH, UA: 7

## 2015-08-18 MED ORDER — POLYETHYLENE GLYCOL 3350 17 GM/SCOOP PO POWD: ORAL | Status: DC

## 2015-08-18 MED ORDER — DOCUSATE SODIUM 50 MG PO CAPS: 50.0000 mg | ORAL_CAPSULE | Freq: Two times a day (BID) | ORAL | Status: DC

## 2015-08-18 NOTE — Progress Notes (Signed)
Subjective:    Megan [name] is a 19 y.o. female being seen today for her obstetrical visit. She is at 7779w6d gestation. Patient reports backache, no bleeding, no leaking and cramping feeling since Friday a few times per day.  Denies any leaking of fluid, or bleeding.  States fetal movement is the same.. Fetal movement: normal.  Problem List Items Addressed This Visit    None    Visit Diagnoses    Slow transit constipation    -  Primary    Relevant Medications    docusate sodium (COLACE) 50 MG capsule      Patient Active Problem List   Diagnosis Date Noted  . Abdominal pain, left lower quadrant 03/08/2013  . Migraine with status migrainosus 03/07/2013   Objective:    BP 118/71 mmHg  Pulse 99  Temp(Src) 98.2 F (36.8 C)  Wt 192 lb (87.091 kg)  LMP 01/18/2015 FHT:  150 BPM  Uterine Size: size equals dates  Presentation: cephalic   Cervix:  Long, thick, 0.5 cm dilated, posterior  Assessment:    Pregnancy @ 3279w6d weeks   constipation  Plan:     labs reviewed, problem list updated Consent signed. GBS sent TDAP offered  Rhogam given for RH negative Pediatrician: discussed. Infant feeding: plans to breastfeed. Maternity leave: discussed. Cigarette smoking: never smoked. No orders of the defined types were placed in this encounter.   Meds ordered this encounter  Medications  . docusate sodium (COLACE) 50 MG capsule    Sig: Take 1 capsule (50 mg total) by mouth 2 (two) times daily.    Dispense:  90 capsule    Refill:  4  . polyethylene glycol powder (GLYCOLAX/MIRALAX) powder    Sig: Take 17 grams by mouth daily.    Dispense:  500 g    Refill:  4   Follow up in 2 Weeks.

## 2015-08-18 NOTE — Addendum Note (Signed)
Addended by: Marya LandryFOSTER, Jashua Knaak D on: 08/18/2015 04:57 PM   Modules accepted: Orders

## 2015-08-29 ENCOUNTER — Inpatient Hospital Stay (HOSPITAL_COMMUNITY)
Admission: AD | Admit: 2015-08-29 | Discharge: 2015-08-29 | Disposition: A | Discharge status: Home / Self Care | Payer: Medicaid Other | Source: Ambulatory Visit | Attending: Obstetrics | Admitting: Obstetrics

## 2015-08-29 ENCOUNTER — Encounter (HOSPITAL_COMMUNITY): Payer: Self-pay | Admitting: *Deleted

## 2015-08-29 DIAGNOSIS — Z3A33 33 weeks gestation of pregnancy: Secondary | ICD-10-CM | POA: Insufficient documentation

## 2015-08-29 DIAGNOSIS — O4703 False labor before 37 completed weeks of gestation, third trimester: Secondary | ICD-10-CM | POA: Diagnosis present

## 2015-08-29 LAB — URINALYSIS, ROUTINE W REFLEX MICROSCOPIC
BILIRUBIN URINE: NEGATIVE
Glucose, UA: NEGATIVE mg/dL
Hgb urine dipstick: NEGATIVE
KETONES UR: NEGATIVE mg/dL
NITRITE: NEGATIVE
PH: 6.5 (ref 5.0–8.0)
Protein, ur: NEGATIVE mg/dL
Specific Gravity, Urine: 1.015 (ref 1.005–1.030)
Urobilinogen, UA: 0.2 mg/dL (ref 0.0–1.0)

## 2015-08-29 LAB — WET PREP, GENITAL
Clue Cells Wet Prep HPF POC: NONE SEEN
Trich, Wet Prep: NONE SEEN
YEAST WET PREP: NONE SEEN

## 2015-08-29 LAB — URINE MICROSCOPIC-ADD ON

## 2015-08-29 LAB — FETAL FIBRONECTIN: FETAL FIBRONECTIN: NEGATIVE

## 2015-08-29 NOTE — Discharge Instructions (Signed)
Preterm Labor Information °Preterm labor is when labor starts before you are [redacted] weeks pregnant. The normal length of pregnancy is 39 to 41 weeks.  °CAUSES  °The cause of preterm labor is not often known. The most common known cause is infection. °RISK FACTORS °· Having a history of preterm labor. °· Having your water break before it should. °· Having a placenta that covers the opening of the cervix. °· Having a placenta that breaks away from the uterus. °· Having a cervix that is too weak to hold the baby in the uterus. °· Having too much fluid in the amniotic sac. °· Taking drugs or smoking while pregnant. °· Not gaining enough weight while pregnant. °· Being younger than 18 and older than 21 years old. °· Having a low income. °· Being African American. °SYMPTOMS °· Period-like cramps, belly (abdominal) pain, or back pain. °· Contractions that are regular, as often as six in an hour. They may be mild or painful. °· Contractions that start at the top of the belly. They then move to the lower belly and back. °· Lower belly pressure that seems to get stronger. °· Bleeding from the vagina. °· Fluid leaking from the vagina. °TREATMENT  °Treatment depends on: °· Your condition. °· The condition of your baby. °· How many weeks pregnant you are. °Your doctor may have you: °· Take medicine to stop contractions. °· Stay in bed except to use the restroom (bed rest). °· Stay in the hospital. °WHAT SHOULD YOU DO IF YOU THINK YOU ARE IN PRETERM LABOR? °Call your doctor right away. You need to go to the hospital right away.  °HOW CAN YOU PREVENT PRETERM LABOR IN FUTURE PREGNANCIES? °· Stop smoking, if you smoke. °· Maintain healthy weight gain. °· Do not take drugs or be around chemicals that are not needed. °· Tell your doctor if you think you have an infection. °· Tell your doctor if you had a preterm labor before. °  °This information is not intended to replace advice given to you by your health care provider. Make sure you  discuss any questions you have with your health care provider. °  °Document Released: 01/05/2009 Document Revised: 02/23/2015 Document Reviewed: 11/11/2012 °Elsevier Interactive Patient Education ©2016 Elsevier Inc. ° °

## 2015-08-29 NOTE — MAU Note (Signed)
Pt presents to MAU with complaints of pain in her abdomen, and pelvic pain. Pt denies any vaginal bleeding or LOF

## 2015-08-29 NOTE — MAU Provider Note (Signed)
Chief Complaint:  Abdominal Pain  First Provider Initiated Contact with Patient 08/29/15 1214      HPI: Megan [name] is a 19 y.o. G1P0 at [redacted]w[redacted]d who presents to maternity admissions reporting pain and tightening in her abdomen since last night. Occasionally bad enough to wake her from her sleep. Improving, but still present this morning. States she was checked in the office last week and told that she was a fingertip dilated.  Location: Tightening around sides and across midabdomen. Pain in low abdomen Quality: Tightening and pain Severity: 6/10 in pain scale Duration: Less than 24 hours Context: None Timing: Constant with intermittent exacerbations Course: Improving since last night Modifying factors: None. Hasn't tried anything for the pain. Associated signs and symptoms: Positive for nausea and mucoid discharge. Negative for fever, chills, vaginal bleeding, vaginal odor, urinary complaints, diarrhea, constipation or vomiting.  Good fetal movement.   Past Medical History: Past Medical History  Diagnosis Date  . Migraines   . GERD (gastroesophageal reflux disease)   . Gallstones     Past obstetric history: OB History  Gravida Para Term Preterm AB SAB TAB Ectopic Multiple Living  1             # Outcome Date GA Lbr Len/2nd Weight Sex Delivery Anes PTL Lv  1 Current               Past Surgical History: Past Surgical History  Procedure Laterality Date  . Tumor removal      L foot     Family History: Family History  Problem Relation Age of Onset  . Heart disease Father   . Hypertension Father   . Diabetes Father   . Stroke Father   . Depression Father     Social History: Social History  Substance Use Topics  . Smoking status: Never Smoker   . Smokeless tobacco: None  . Alcohol Use: No    Allergies: No Known Allergies  Meds:  Prescriptions prior to admission  Medication Sig Dispense Refill Last Dose  . acetaminophen (TYLENOL) 500 MG tablet Take 500 mg  by mouth every 6 (six) hours as needed for headache.   08/29/2015 at Unknown time  . docusate sodium (COLACE) 50 MG capsule Take 1 capsule (50 mg total) by mouth 2 (two) times daily. 90 capsule 4 08/28/2015 at Unknown time  . Doxylamine-Pyridoxine (DICLEGIS) 10-10 MG TBEC Take 2 tablets by mouth at bedtime. 100 tablet 3 08/28/2015 at Unknown time  . Prenat-Fe Poly-Methfol-FA-DHA (VITAFOL ULTRA) 29-0.6-0.4-200 MG CAPS Take 1 tablet by mouth daily. 30 capsule 12 08/28/2015 at Unknown time  . butalbital-acetaminophen-caffeine (FIORICET) 50-325-40 MG tablet Take 1-2 tablets by mouth every 6 (six) hours as needed for headache. 40 tablet 3 prn  . fluconazole (DIFLUCAN) 100 MG tablet Take 1 tablet (100 mg total) by mouth once. Repeat dose in 48-72 hour. (Patient not taking: Reported on 07/06/2015) 3 tablet 0 Not Taking  . ondansetron (ZOFRAN-ODT) 4 MG disintegrating tablet Take 1 tablet (4 mg total) by mouth every 8 (eight) hours as needed for nausea or vomiting. (Patient not taking: Reported on 08/29/2015) 10 tablet 0 Taking  . polyethylene glycol powder (GLYCOLAX/MIRALAX) powder Take 17 grams by mouth daily. (Patient not taking: Reported on 08/29/2015) 500 g 4   . terconazole (TERAZOL 7) 0.4 % vaginal cream Place 1 applicator vaginally at bedtime. (Patient not taking: Reported on 07/06/2015) 45 g 0 Not Taking    I have reviewed patient's Past Medical Hx, Surgical Hx,  Family Hx, Social Hx, medications and allergies.   ROS:  Review of Systems  Constitutional: Negative for fever and chills.  Gastrointestinal: Positive for nausea and abdominal pain. Negative for vomiting, diarrhea and constipation.  Genitourinary: Positive for vaginal discharge. Negative for dysuria, urgency, frequency, hematuria, flank pain, vaginal bleeding, vaginal pain and pelvic pain.    Physical Exam   Patient Vitals for the past 24 hrs:  BP Temp Pulse Resp  08/29/15 1409 (!) 116/52 mmHg - 90 18  08/29/15 1144 - 98.1 F (36.7 C) - -   08/29/15 1140 128/68 mmHg - 92 -   Constitutional: Well-developed, well-nourished female in no acute distress.  Cardiovascular: normal rate Respiratory: normal effort GI: Abd soft, non-tender, gravid appropriate for gestational age.  MS: Extremities nontender, no edema, normal ROM Neurologic: Alert and oriented x 4.  GU: Neg CVAT.  Pelvic: NEFG, physiologic discharge, no blood, cervix clean. No CMT  Dilation: Fingertip Effacement (%): Thick Cervical Position: Posterior Station: Ballotable Presentation: Undeterminable Exam by:: Ivonne Andrew CNM  FHT:  Baseline 140 , moderate variability, accelerations present, no decelerations Contractions: 1 w/ UI   Labs: Results for orders placed or performed during the hospital encounter of 08/29/15 (from the past 24 hour(s))  Urinalysis, Routine w reflex microscopic (not at [facility])     Status: Abnormal   Collection Time: 08/29/15 11:30 AM  Result Value Ref Range   Color, Urine YELLOW YELLOW   APPearance CLEAR CLEAR   Specific Gravity, Urine 1.015 1.005 - 1.030   pH 6.5 5.0 - 8.0   Glucose, UA NEGATIVE NEGATIVE mg/dL   Hgb urine dipstick NEGATIVE NEGATIVE   Bilirubin Urine NEGATIVE NEGATIVE   Ketones, ur NEGATIVE NEGATIVE mg/dL   Protein, ur NEGATIVE NEGATIVE mg/dL   Urobilinogen, UA 0.2 0.0 - 1.0 mg/dL   Nitrite NEGATIVE NEGATIVE   Leukocytes, UA MODERATE (A) NEGATIVE  Urine microscopic-add on     Status: Abnormal   Collection Time: 08/29/15 11:30 AM  Result Value Ref Range   Squamous Epithelial / LPF FEW (A) RARE   WBC, UA 3-6 <3 WBC/hpf   RBC / HPF 0-2 <3 RBC/hpf   Bacteria, UA MANY (A) RARE  Wet prep, genital     Status: Abnormal   Collection Time: 08/29/15 12:25 PM  Result Value Ref Range   Yeast Wet Prep HPF POC NONE SEEN NONE SEEN   Trich, Wet Prep NONE SEEN NONE SEEN   Clue Cells Wet Prep HPF POC NONE SEEN NONE SEEN   WBC, Wet Prep HPF POC MANY (A) NONE SEEN  Fetal fibronectin     Status: None   Collection Time: 08/29/15 12:25  PM  Result Value Ref Range   Fetal Fibronectin NEGATIVE NEGATIVE    Imaging:  NA  MAU Course: Fetal fibronectin, wet prep, UA, GC/chlamydia cultures, push fluids.  MDM: 19 year old female at 75 weeks and 3 days gestation with preterm contractions but no evidence of active preterm labor.  Assessment: 1. Preterm contractions, third trimester    Plan: Discharge home in stable condition.  Preterm labor precautions and fetal kick counts. Increase fluids and rest.     Follow-up Information    Follow up with [facility] On 09/01/2015.   Why:  Routine prenatal visit or sooner As needed if symptoms worsen   Contact information:   [address] [place] [address] [phone]      Follow up with [facility] [place] MATERNITY ADMISSIONS.   Why:  As needed in emergencies   Contact information:   [address] [id number] mc [place] [id number] [phone]        Medication List    STOP taking these medications        fluconazole 100 MG tablet  Commonly known as:  DIFLUCAN     ondansetron 4 MG disintegrating tablet  Commonly known as:  ZOFRAN-ODT     polyethylene glycol powder powder  Commonly known as:  GLYCOLAX/MIRALAX     terconazole 0.4 % vaginal cream  Commonly known as:  TERAZOL 7      TAKE these medications        acetaminophen 500 MG tablet  Commonly known as:  TYLENOL  Take 500 mg by mouth every 6 (six) hours as needed for headache.     butalbital-acetaminophen-caffeine 50-325-40 MG tablet  Commonly known as:  FIORICET  Take 1-2 tablets by mouth every 6 (six) hours as needed for headache.     docusate sodium 50 MG capsule  Commonly known as:  COLACE  Take 1 capsule (50 mg total) by mouth 2 (two) times daily.     Doxylamine-Pyridoxine 10-10 MG Tbec  Commonly known as:  DICLEGIS  Take 2 tablets by mouth at bedtime.     VITAFOL ULTRA 29-0.6-0.4-200 MG Caps  Take 1  tablet by mouth daily.        [place] Dean Goldner, CNM 08/29/2015 2:11 PM

## 2015-08-30 LAB — GC/CHLAMYDIA PROBE AMP (~~LOC~~) NOT AT ARMC
CHLAMYDIA, DNA PROBE: NEGATIVE
Neisseria Gonorrhea: NEGATIVE

## 2015-09-01 ENCOUNTER — Ambulatory Visit (INDEPENDENT_AMBULATORY_CARE_PROVIDER_SITE_OTHER): Payer: Medicaid Other | Admitting: Certified Nurse Midwife

## 2015-09-01 VITALS — BP 115/77 | HR 94 | Temp 98.7°F | Wt 194.6 lb

## 2015-09-01 DIAGNOSIS — Z3403 Encounter for supervision of normal first pregnancy, third trimester: Secondary | ICD-10-CM

## 2015-09-01 LAB — POCT URINALYSIS DIPSTICK
Bilirubin, UA: NEGATIVE
Blood, UA: NEGATIVE
Glucose, UA: 100
Ketones, UA: NEGATIVE
Nitrite, UA: NEGATIVE
Spec Grav, UA: 1.015
UROBILINOGEN UA: NEGATIVE
pH, UA: 6

## 2015-09-01 NOTE — Progress Notes (Signed)
Subjective:    Megan [name] is a 19 y.o. female being seen today for her obstetrical visit. She is at 9635w6d gestation. Patient reports no complaints. Fetal movement: normal.  Problem List Items Addressed This Visit    None    Visit Diagnoses    Encounter for supervision of normal first pregnancy in third trimester    -  Primary    Relevant Orders    POCT urinalysis dipstick      Patient Active Problem List   Diagnosis Date Noted  . Abdominal pain, left lower quadrant 03/08/2013  . Migraine with status migrainosus 03/07/2013   Objective:    BP 115/77 mmHg  Pulse 94  Temp(Src) 98.7 F (37.1 C)  Wt 194 lb 9.6 oz (88.27 kg)  LMP 01/18/2015 FHT:  150 BPM  Uterine Size: size equals dates  Presentation: cephalic     Assessment:    Pregnancy @ 2335w6d weeks   Plan:     labs reviewed, problem list updated Consent signed. GBS sent TDAP offered  Rhogam given for RH negative Pediatrician: discussed. Infant feeding: plans to breastfeed. Maternity leave: discussed. Cigarette smoking: never smoked. Orders Placed This Encounter  Procedures  . POCT urinalysis dipstick   No orders of the defined types were placed in this encounter.   Follow up in 2 Weeks with GBS.

## 2015-09-14 ENCOUNTER — Ambulatory Visit (INDEPENDENT_AMBULATORY_CARE_PROVIDER_SITE_OTHER): Payer: Medicaid Other | Admitting: Certified Nurse Midwife

## 2015-09-14 VITALS — BP 142/79 | HR 88 | Temp 98.0°F | Wt 198.0 lb

## 2015-09-14 DIAGNOSIS — Z3403 Encounter for supervision of normal first pregnancy, third trimester: Secondary | ICD-10-CM

## 2015-09-14 LAB — POCT URINALYSIS DIPSTICK
Bilirubin, UA: NEGATIVE
Blood, UA: NEGATIVE
GLUCOSE UA: 50
Ketones, UA: NEGATIVE
Nitrite, UA: NEGATIVE
PROTEIN UA: NEGATIVE
SPEC GRAV UA: 1.01
UROBILINOGEN UA: NEGATIVE
pH, UA: 6.5

## 2015-09-14 NOTE — Progress Notes (Signed)
Subjective:    Megan [name] is a 19 y.o. female being seen today for her obstetrical visit. She is at 7458w5d gestation. Patient reports no complaints. Fetal movement: normal.  Problem List Items Addressed This Visit    None    Visit Diagnoses    Encounter for supervision of normal first pregnancy in third trimester    -  Primary    Relevant Orders    POCT urinalysis dipstick (Completed)    Strep B DNA probe      Patient Active Problem List   Diagnosis Date Noted  . Abdominal pain, left lower quadrant 03/08/2013  . Migraine with status migrainosus 03/07/2013   Objective:    BP 142/79 mmHg  Pulse 88  Temp(Src) 98 F (36.7 C)  Wt 198 lb (89.812 kg)  LMP 01/18/2015 FHT:  145 BPM  Uterine Size: size equals dates  Presentation: cephalic   Cervix: long, thick, closed and posterior  Assessment:    Pregnancy @ 1258w5d weeks   Doing well  Plan:     labs reviewed, problem list updated Consent signed. GBS sent TDAP offered  Rhogam given for RH negative Pediatrician: discussed. Infant feeding: plans to breastfeed. Maternity leave: discussed. Cigarette smoking: never smoked. Orders Placed This Encounter  Procedures  . Strep B DNA probe  . POCT urinalysis dipstick   No orders of the defined types were placed in this encounter.   Follow up in 1 Week.

## 2015-09-16 LAB — STREP B DNA PROBE: GBSP: NOT DETECTED

## 2015-09-21 ENCOUNTER — Other Ambulatory Visit: Payer: Self-pay | Admitting: Certified Nurse Midwife

## 2015-09-21 ENCOUNTER — Ambulatory Visit (INDEPENDENT_AMBULATORY_CARE_PROVIDER_SITE_OTHER): Payer: Medicaid Other | Admitting: Certified Nurse Midwife

## 2015-09-21 VITALS — BP 115/76 | HR 77 | Temp 98.7°F | Wt 199.8 lb

## 2015-09-21 DIAGNOSIS — Z3403 Encounter for supervision of normal first pregnancy, third trimester: Secondary | ICD-10-CM

## 2015-09-21 LAB — POCT URINALYSIS DIPSTICK
BILIRUBIN UA: NEGATIVE
Blood, UA: NEGATIVE
Glucose, UA: 50
KETONES UA: NEGATIVE
Nitrite, UA: NEGATIVE
PROTEIN UA: NEGATIVE
SPEC GRAV UA: 1.015
Urobilinogen, UA: NEGATIVE
pH, UA: 6

## 2015-09-21 NOTE — Progress Notes (Signed)
Pt is doing well, no concerns today. 

## 2015-09-21 NOTE — Progress Notes (Signed)
Subjective:    Lanier Ensignaya N Tumblin is a 19 y.o. female being seen today for her obstetrical visit. She is at 4030w5d gestation. Patient reports no complaints. Fetal movement: normal.  Problem List Items Addressed This Visit    None    Visit Diagnoses    Encounter for supervision of normal first pregnancy in third trimester    -  Primary    Relevant Orders    POCT urinalysis dipstick (Completed)      Patient Active Problem List   Diagnosis Date Noted  . Abdominal pain, left lower quadrant 03/08/2013  . Migraine with status migrainosus 03/07/2013   Objective:    BP 115/76 mmHg  Pulse 77  Temp(Src) 98.7 F (37.1 C)  Wt 199 lb 12.8 oz (90.629 kg)  LMP 01/18/2015 FHT:  155 BPM  Uterine Size: size equals dates  Presentation: cephalic     Assessment:    Pregnancy @ 8430w5d weeks   Plan:     labs reviewed, problem list updated Consent signed. GBS sent TDAP offered  Rhogam given for RH negative Pediatrician: discussed. Infant feeding: plans to breastfeed. Maternity leave: discussed. Cigarette smoking: never smoked. Orders Placed This Encounter  Procedures  . POCT urinalysis dipstick   No orders of the defined types were placed in this encounter.   Follow up in 1 Week.

## 2015-09-28 ENCOUNTER — Ambulatory Visit (INDEPENDENT_AMBULATORY_CARE_PROVIDER_SITE_OTHER): Payer: Medicaid Other | Admitting: Certified Nurse Midwife

## 2015-09-28 VITALS — BP 123/83 | HR 96 | Temp 98.3°F | Wt 201.0 lb

## 2015-09-28 DIAGNOSIS — Z3403 Encounter for supervision of normal first pregnancy, third trimester: Secondary | ICD-10-CM

## 2015-09-28 LAB — POCT URINALYSIS DIPSTICK
BILIRUBIN UA: NEGATIVE
Blood, UA: NEGATIVE
Glucose, UA: 100
Ketones, UA: NEGATIVE
NITRITE UA: NEGATIVE
PH UA: 7
Spec Grav, UA: 1.01
Urobilinogen, UA: NEGATIVE

## 2015-09-28 NOTE — Progress Notes (Signed)
Subjective:    Megan [name] is a 19 y.o. female being seen today for her obstetrical visit. She is at 5761w5d gestation. Patient reports no complaints. Fetal movement: normal.  Problem List Items Addressed This Visit    None    Visit Diagnoses    Encounter for supervision of normal first pregnancy in third trimester    -  Primary    Relevant Orders    POCT urinalysis dipstick (Completed)      Patient Active Problem List   Diagnosis Date Noted  . Abdominal pain, left lower quadrant 03/08/2013  . Migraine with status migrainosus 03/07/2013    Objective:    BP 123/83 mmHg  Pulse 96  Temp(Src) 98.3 F (36.8 C)  Wt 201 lb (91.173 kg)  LMP 01/18/2015 FHT: 140 BPM  Uterine Size: size equals dates  Presentations: cephalic  Pelvic Exam: deferred     Assessment:    Pregnancy @ 3661w5d weeks   Doing well.   Plan:   Plans for delivery: Vaginal anticipated; labs reviewed; problem list updated Counseling: Consent signed. Infant feeding: plans to breastfeed. Cigarette smoking: never smoked. L&D discussion: symptoms of labor, discussed when to call, discussed what number to call, anesthetic/analgesic options reviewed and delivering clinician:  plans no preference. Postpartum supports and preparation: circumcision discussed and contraception plans discussed.  Follow up in 1 Week.

## 2015-09-28 NOTE — Progress Notes (Signed)
Patient reports doing well 

## 2015-10-01 ENCOUNTER — Encounter (HOSPITAL_COMMUNITY): Payer: Self-pay | Admitting: *Deleted

## 2015-10-01 ENCOUNTER — Inpatient Hospital Stay (HOSPITAL_COMMUNITY)
Admission: AD | Admit: 2015-10-01 | Discharge: 2015-10-01 | Disposition: A | Discharge status: Home / Self Care | Payer: Medicaid Other | Source: Ambulatory Visit | Attending: Obstetrics | Admitting: Obstetrics

## 2015-10-01 DIAGNOSIS — Z3493 Encounter for supervision of normal pregnancy, unspecified, third trimester: Secondary | ICD-10-CM | POA: Diagnosis not present

## 2015-10-01 NOTE — Progress Notes (Signed)
Dr Clearance CootsHarper notified of pt's admission and status. Aware of ctx pattern, sve, reactive FHR. Pt stable for d/c home. May have Ambien 5mg  po before leaving if pt desires.

## 2015-10-01 NOTE — Discharge Instructions (Signed)
Braxton Hicks Contractions °Contractions of the uterus can occur throughout pregnancy. Contractions are not always a sign that you are in labor.  °WHAT ARE BRAXTON HICKS CONTRACTIONS?  °Contractions that occur before labor are called Braxton Hicks contractions, or false labor. Toward the end of pregnancy (32-34 weeks), these contractions can develop more often and may become more forceful. This is not true labor because these contractions do not result in opening (dilatation) and thinning of the cervix. They are sometimes difficult to tell apart from true labor because these contractions can be forceful and people have different pain tolerances. You should not feel embarrassed if you go to the hospital with false labor. Sometimes, the only way to tell if you are in true labor is for your health care provider to look for changes in the cervix. °If there are no prenatal problems or other health problems associated with the pregnancy, it is completely safe to be sent home with false labor and await the onset of true labor. °HOW CAN YOU TELL THE DIFFERENCE BETWEEN TRUE AND FALSE LABOR? °False Labor °· The contractions of false labor are usually shorter and not as hard as those of true labor.   °· The contractions are usually irregular.   °· The contractions are often felt in the front of the lower abdomen and in the groin.   °· The contractions may go away when you walk around or change positions while lying down.   °· The contractions get weaker and are shorter lasting as time goes on.   °· The contractions do not usually become progressively stronger, regular, and closer together as with true labor.   °True Labor °· Contractions in true labor last 30-70 seconds, become very regular, usually become more intense, and increase in frequency.   °· The contractions do not go away with walking.   °· The discomfort is usually felt in the top of the uterus and spreads to the lower abdomen and low back.   °· True labor can be  determined by your health care provider with an exam. This will show that the cervix is dilating and getting thinner.   °WHAT TO REMEMBER °· Keep up with your usual exercises and follow other instructions given by your health care provider.   °· Take medicines as directed by your health care provider.   °· Keep your regular prenatal appointments.   °· Eat and drink lightly if you think you are going into labor.   °· If Braxton Hicks contractions are making you uncomfortable:   °¨ Change your position from lying down or resting to walking, or from walking to resting.   °¨ Sit and rest in a tub of warm water.   °¨ Drink 2-3 glasses of water. Dehydration may cause these contractions.   °¨ Do slow and deep breathing several times an hour.   °WHEN SHOULD I SEEK IMMEDIATE MEDICAL CARE? °Seek immediate medical care if: °· Your contractions become stronger, more regular, and closer together.   °· You have fluid leaking or gushing from your vagina.   °· You have a fever.   °· You pass blood-tinged mucus.   °· You have vaginal bleeding.   °· You have continuous abdominal pain.   °· You have low back pain that you never had before.   °· You feel your baby's head pushing down and causing pelvic pressure.   °· Your baby is not moving as much as it used to.   °  °This information is not intended to replace advice given to you by your health care provider. Make sure you discuss any questions you have with your health care   provider. °  °Document Released: 10/09/2005 Document Revised: 10/14/2013 Document Reviewed: 07/21/2013 °Elsevier Interactive Patient Education ©2016 Elsevier Inc. ° °

## 2015-10-01 NOTE — Progress Notes (Signed)
Written and verbal d/c instructions given and understanding voiced. Pt does not want Ambien for sleep.

## 2015-10-01 NOTE — MAU Note (Signed)
Contractions since 1630 today. Was leaking fld yesterday but none today.

## 2015-10-05 ENCOUNTER — Ambulatory Visit (INDEPENDENT_AMBULATORY_CARE_PROVIDER_SITE_OTHER): Payer: Medicaid Other | Admitting: Certified Nurse Midwife

## 2015-10-05 VITALS — BP 118/70 | HR 90 | Temp 98.3°F | Wt 201.0 lb

## 2015-10-05 DIAGNOSIS — Z3403 Encounter for supervision of normal first pregnancy, third trimester: Secondary | ICD-10-CM

## 2015-10-05 LAB — POCT URINALYSIS DIPSTICK
BILIRUBIN UA: NEGATIVE
GLUCOSE UA: 100
KETONES UA: NEGATIVE
Leukocytes, UA: NEGATIVE
Nitrite, UA: NEGATIVE
Protein, UA: NEGATIVE
RBC UA: NEGATIVE
SPEC GRAV UA: 1.015
UROBILINOGEN UA: NEGATIVE
pH, UA: 6

## 2015-10-05 NOTE — Progress Notes (Signed)
Subjective:    Megan [name] is a 19 y.o. female being seen today for her obstetrical visit. She is at 521w5d gestation. Patient reports no bleeding, occasional contractions and increased vaginal discharge with occasional contractions. Fetal movement: normal.  Problem List Items Addressed This Visit    None    Visit Diagnoses    Encounter for supervision of normal first pregnancy in third trimester    -  Primary    Relevant Orders    POCT urinalysis dipstick (Completed)    SureSwab, Vaginosis/Vaginitis Plus      Patient Active Problem List   Diagnosis Date Noted  . Abdominal pain, left lower quadrant 03/08/2013  . Migraine with status migrainosus 03/07/2013    Objective:    BP 118/70 mmHg  Pulse 90  Temp(Src) 98.3 F (36.8 C)  Wt 201 lb (91.173 kg)  LMP 01/18/2015 FHT: 150 BPM  Uterine Size: size equals dates  Presentations: cephalic  Pelvic Exam:              Dilation: Closed       Effacement: Long             Station:  -3    Consistency: soft            Position: posterior   Neg. Nitrazine paper.  No pooling.    Assessment:    Pregnancy @ [redacted]w[redacted]d weeks   Leukorrhea of pregnancy  Plan:   Plans for delivery: Vaginal anticipated; labs reviewed; problem list updated Counseling: Consent signed. Infant feeding: plans to breastfeed. Cigarette smoking: never smoked. L&D discussion: symptoms of labor, discussed when to call, discussed what number to call, anesthetic/analgesic options reviewed and delivering clinician:  plans no preference. Postpartum supports and preparation: circumcision discussed and contraception plans discussed.  Follow up in 1 Week.

## 2015-10-12 ENCOUNTER — Ambulatory Visit (INDEPENDENT_AMBULATORY_CARE_PROVIDER_SITE_OTHER): Payer: Medicaid Other | Admitting: Obstetrics

## 2015-10-12 VITALS — BP 125/81 | HR 100 | Temp 98.1°F | Wt 202.0 lb

## 2015-10-12 DIAGNOSIS — Z3403 Encounter for supervision of normal first pregnancy, third trimester: Secondary | ICD-10-CM

## 2015-10-12 LAB — SURESWAB, VAGINOSIS/VAGINITIS PLUS
ATOPOBIUM VAGINAE: NOT DETECTED Log (cells/mL)
C. ALBICANS, DNA: NOT DETECTED
C. PARAPSILOSIS, DNA: NOT DETECTED
C. TRACHOMATIS RNA, TMA: NOT DETECTED
C. glabrata, DNA: NOT DETECTED
C. tropicalis, DNA: NOT DETECTED
Gardnerella vaginalis: <4.7 Log (cells/mL)
LACTOBACILLUS SPECIES: 7.7 Log (cells/mL)
MEGASPHAERA SPECIES: NOT DETECTED Log (cells/mL)
N. gonorrhoeae RNA, TMA: NOT DETECTED
T. vaginalis RNA, QL TMA: NOT DETECTED

## 2015-10-12 LAB — POCT URINALYSIS DIPSTICK
BILIRUBIN UA: NEGATIVE
Blood, UA: NEGATIVE
GLUCOSE UA: NEGATIVE
Ketones, UA: NEGATIVE
NITRITE UA: NEGATIVE
PH UA: 7
Protein, UA: NEGATIVE
Spec Grav, UA: <=1.005
Urobilinogen, UA: NEGATIVE

## 2015-10-12 NOTE — Progress Notes (Signed)
Patient is concerned about when herbaby is going to come

## 2015-10-13 ENCOUNTER — Encounter: Payer: Self-pay | Admitting: Obstetrics

## 2015-10-13 NOTE — Progress Notes (Signed)
Subjective:    Megan [name] is a 19 y.o. female being seen today for her obstetrical visit. She is at 807w6d gestation. Patient reports no complaints. Fetal movement: normal.  Problem List Items Addressed This Visit    None    Visit Diagnoses    Encounter for supervision of normal first pregnancy in third trimester    -  Primary    Relevant Orders    POCT urinalysis dipstick (Completed)      Patient Active Problem List   Diagnosis Date Noted  . Abdominal pain, left lower quadrant 03/08/2013  . Migraine with status migrainosus 03/07/2013    Objective:    BP 125/81 mmHg  Pulse 100  Temp(Src) 98.1 F (36.7 C)  Wt 202 lb (91.627 kg)  LMP 01/18/2015 FHT: 150 BPM  Uterine Size: size equals dates  Presentations: cephalic    Assessment:    Pregnancy @ 407w6d weeks   Plan:   Plans for delivery: Vaginal anticipated; labs reviewed; problem list updated Counseling: Consent signed. Infant feeding: plans to breastfeed. Cigarette smoking: never smoked. L&D discussion: symptoms of labor, discussed when to call, discussed what number to call, anesthetic/analgesic options reviewed and delivering clinician:  plans no preference. Postpartum supports and preparation: circumcision discussed and contraception plans discussed.  Follow up in 1 Week.

## 2015-10-15 ENCOUNTER — Other Ambulatory Visit: Payer: Self-pay | Admitting: Certified Nurse Midwife

## 2015-10-18 ENCOUNTER — Other Ambulatory Visit: Payer: Self-pay | Admitting: Obstetrics

## 2015-10-19 ENCOUNTER — Ambulatory Visit (INDEPENDENT_AMBULATORY_CARE_PROVIDER_SITE_OTHER): Payer: Medicaid Other | Admitting: Certified Nurse Midwife

## 2015-10-19 ENCOUNTER — Telehealth (HOSPITAL_COMMUNITY): Payer: Self-pay | Admitting: *Deleted

## 2015-10-19 VITALS — BP 120/80 | HR 91 | Temp 98.5°F | Wt 203.0 lb

## 2015-10-19 DIAGNOSIS — O480 Post-term pregnancy: Secondary | ICD-10-CM

## 2015-10-19 DIAGNOSIS — Z3493 Encounter for supervision of normal pregnancy, unspecified, third trimester: Secondary | ICD-10-CM

## 2015-10-19 LAB — POCT URINALYSIS DIPSTICK
BILIRUBIN UA: NEGATIVE
Blood, UA: NEGATIVE
Glucose, UA: NEGATIVE
KETONES UA: NEGATIVE
Nitrite, UA: NEGATIVE
Urobilinogen, UA: NEGATIVE
pH, UA: 7

## 2015-10-19 NOTE — Telephone Encounter (Signed)
Preadmission screen  

## 2015-10-19 NOTE — Progress Notes (Signed)
Patient is doing well- needs to deliver

## 2015-10-20 ENCOUNTER — Inpatient Hospital Stay (HOSPITAL_COMMUNITY)
Admission: RE | Admit: 2015-10-20 | Discharge: 2015-10-23 | DRG: 775 | Disposition: A | Discharge status: Home / Self Care | Payer: Medicaid Other | Source: Ambulatory Visit | Attending: Obstetrics | Admitting: Obstetrics

## 2015-10-20 ENCOUNTER — Encounter (HOSPITAL_COMMUNITY): Payer: Self-pay

## 2015-10-20 ENCOUNTER — Inpatient Hospital Stay (HOSPITAL_COMMUNITY): Payer: Medicaid Other | Admitting: Anesthesiology

## 2015-10-20 DIAGNOSIS — O9962 Diseases of the digestive system complicating childbirth: Secondary | ICD-10-CM | POA: Diagnosis present

## 2015-10-20 DIAGNOSIS — K219 Gastro-esophageal reflux disease without esophagitis: Secondary | ICD-10-CM | POA: Diagnosis present

## 2015-10-20 DIAGNOSIS — Z3A41 41 weeks gestation of pregnancy: Secondary | ICD-10-CM

## 2015-10-20 DIAGNOSIS — O480 Post-term pregnancy: Principal | ICD-10-CM | POA: Diagnosis present

## 2015-10-20 DIAGNOSIS — O324XX0 Maternal care for high head at term, not applicable or unspecified: Secondary | ICD-10-CM | POA: Diagnosis present

## 2015-10-20 DIAGNOSIS — O9081 Anemia of the puerperium: Secondary | ICD-10-CM | POA: Diagnosis present

## 2015-10-20 DIAGNOSIS — D649 Anemia, unspecified: Secondary | ICD-10-CM | POA: Diagnosis present

## 2015-10-20 DIAGNOSIS — Z98891 History of uterine scar from previous surgery: Secondary | ICD-10-CM

## 2015-10-20 LAB — CBC
HCT: 33.1 % — ABNORMAL LOW (ref 36.0–46.0)
Hemoglobin: 10.8 g/dL — ABNORMAL LOW (ref 12.0–15.0)
MCH: 29.2 pg (ref 26.0–34.0)
MCHC: 32.6 g/dL (ref 30.0–36.0)
MCV: 89.5 fL (ref 78.0–100.0)
PLATELETS: 274 10*3/uL (ref 150–400)
RBC: 3.7 MIL/uL — ABNORMAL LOW (ref 3.87–5.11)
RDW: 13.6 % (ref 11.5–15.5)
WBC: 11 10*3/uL — ABNORMAL HIGH (ref 4.0–10.5)

## 2015-10-20 LAB — TYPE AND SCREEN
ABO/RH(D): A POS
ANTIBODY SCREEN: NEGATIVE

## 2015-10-20 LAB — ABO/RH: ABO/RH(D): A POS

## 2015-10-20 MED ORDER — PHENYLEPHRINE 40 MCG/ML (10ML) SYRINGE FOR IV PUSH (FOR BLOOD PRESSURE SUPPORT)
80.0000 ug | PREFILLED_SYRINGE | INTRAVENOUS | Status: DC | PRN
  Filled 2015-10-20: qty 2
  Filled 2015-10-20: qty 20

## 2015-10-20 MED ORDER — NALBUPHINE HCL 10 MG/ML IJ SOLN
10.0000 mg | Freq: Once | INTRAMUSCULAR | Status: AC
  Administered 2015-10-20: 10 mg via INTRAMUSCULAR
  Filled 2015-10-20: qty 1

## 2015-10-20 MED ORDER — OXYTOCIN 40 UNITS IN LACTATED RINGERS INFUSION - SIMPLE MED
1.0000 m[IU]/min | INTRAVENOUS | Status: DC
  Administered 2015-10-20: 1 m[IU]/min via INTRAVENOUS

## 2015-10-20 MED ORDER — LIDOCAINE HCL (PF) 1 % IJ SOLN
30.0000 mL | INTRAMUSCULAR | Status: DC | PRN
  Filled 2015-10-20: qty 30

## 2015-10-20 MED ORDER — NALBUPHINE HCL 10 MG/ML IJ SOLN
10.0000 mg | INTRAMUSCULAR | Status: DC | PRN
  Administered 2015-10-20 (×2): 10 mg via INTRAVENOUS
  Filled 2015-10-20 (×2): qty 1

## 2015-10-20 MED ORDER — PROMETHAZINE HCL 25 MG/ML IJ SOLN
25.0000 mg | Freq: Once | INTRAMUSCULAR | Status: AC
  Administered 2015-10-20: 25 mg via INTRAMUSCULAR
  Filled 2015-10-20: qty 1

## 2015-10-20 MED ORDER — LIDOCAINE HCL (PF) 1 % IJ SOLN
INTRAMUSCULAR | Status: DC | PRN
  Administered 2015-10-20 (×2): 4 mL via EPIDURAL

## 2015-10-20 MED ORDER — TERBUTALINE SULFATE 1 MG/ML IJ SOLN: 0.2500 mg | Freq: Once | INTRAMUSCULAR | Status: DC | PRN

## 2015-10-20 MED ORDER — FLEET ENEMA 7-19 GM/118ML RE ENEM: 1.0000 | ENEMA | RECTAL | Status: DC | PRN

## 2015-10-20 MED ORDER — MISOPROSTOL 200 MCG PO TABS
50.0000 ug | ORAL_TABLET | ORAL | Status: DC | PRN
Start: 2015-10-20 — End: 2015-10-21
  Administered 2015-10-20 (×2): 50 ug via ORAL
  Filled 2015-10-20 (×2): qty 1

## 2015-10-20 MED ORDER — MISOPROSTOL 25 MCG QUARTER TABLET: 25.0000 ug | ORAL_TABLET | ORAL | Status: DC | PRN

## 2015-10-20 MED ORDER — FENTANYL 2.5 MCG/ML BUPIVACAINE 1/10 % EPIDURAL INFUSION (WH - ANES)
14.0000 mL/h | INTRAMUSCULAR | Status: DC | PRN
  Administered 2015-10-20: 13 mL/h via EPIDURAL
  Administered 2015-10-20 – 2015-10-21 (×2): 14 mL/h via EPIDURAL
  Filled 2015-10-20 (×2): qty 125

## 2015-10-20 MED ORDER — OXYCODONE-ACETAMINOPHEN 5-325 MG PO TABS: 1.0000 | ORAL_TABLET | ORAL | Status: DC | PRN

## 2015-10-20 MED ORDER — EPHEDRINE 5 MG/ML INJ
10.0000 mg | INTRAVENOUS | Status: DC | PRN
Start: 2015-10-20 — End: 2015-10-21
  Filled 2015-10-20: qty 2

## 2015-10-20 MED ORDER — DIPHENHYDRAMINE HCL 50 MG/ML IJ SOLN: 12.5000 mg | INTRAMUSCULAR | Status: DC | PRN

## 2015-10-20 MED ORDER — NALBUPHINE HCL 10 MG/ML IJ SOLN
10.0000 mg | Freq: Once | INTRAMUSCULAR | Status: AC
  Administered 2015-10-20: 10 mg via INTRAVENOUS
  Filled 2015-10-20: qty 1

## 2015-10-20 MED ORDER — OXYTOCIN BOLUS FROM INFUSION: 500.0000 mL | INTRAVENOUS | Status: DC

## 2015-10-20 MED ORDER — CITRIC ACID-SODIUM CITRATE 334-500 MG/5ML PO SOLN
30.0000 mL | ORAL | Status: DC | PRN
  Administered 2015-10-21: 30 mL via ORAL
  Filled 2015-10-20: qty 15

## 2015-10-20 MED ORDER — FENTANYL CITRATE (PF) 100 MCG/2ML IJ SOLN: 100.0000 ug | Freq: Once | INTRAMUSCULAR | Status: DC

## 2015-10-20 MED ORDER — OXYTOCIN 40 UNITS IN LACTATED RINGERS INFUSION - SIMPLE MED
62.5000 mL/h | INTRAVENOUS | Status: DC
  Filled 2015-10-20: qty 1000

## 2015-10-20 MED ORDER — OXYCODONE-ACETAMINOPHEN 5-325 MG PO TABS: 2.0000 | ORAL_TABLET | ORAL | Status: DC | PRN

## 2015-10-20 MED ORDER — ACETAMINOPHEN 325 MG PO TABS
650.0000 mg | ORAL_TABLET | ORAL | Status: DC | PRN
  Administered 2015-10-20: 650 mg via ORAL
  Filled 2015-10-20: qty 2

## 2015-10-20 MED ORDER — LACTATED RINGERS IV SOLN
INTRAVENOUS | Status: DC
  Administered 2015-10-20 – 2015-10-21 (×4): via INTRAVENOUS

## 2015-10-20 MED ORDER — ONDANSETRON HCL 4 MG/2ML IJ SOLN: 4.0000 mg | Freq: Four times a day (QID) | INTRAMUSCULAR | Status: DC | PRN

## 2015-10-20 MED ORDER — LACTATED RINGERS IV SOLN
500.0000 mL | INTRAVENOUS | Status: DC | PRN
  Administered 2015-10-20: 500 mL via INTRAVENOUS

## 2015-10-20 NOTE — Anesthesia Preprocedure Evaluation (Addendum)
Anesthesia Evaluation  Patient identified by MRN, date of birth, ID band Patient awake    Reviewed: Allergy & Precautions, Patient's Chart, lab work & pertinent test results  Airway Mallampati: III  TM Distance: >3 FB Neck ROM: Full    Dental no notable dental hx. (+) Teeth Intact   Pulmonary neg pulmonary ROS,    Pulmonary exam normal breath sounds clear to auscultation       Cardiovascular negative cardio ROS Normal cardiovascular exam Rhythm:Regular Rate:Normal     Neuro/Psych  Headaches, negative psych ROS   GI/Hepatic GERD  Medicated and Controlled,  Endo/Other  Obesity  Renal/GU negative Renal ROS  negative genitourinary   Musculoskeletal negative musculoskeletal ROS (+)   Abdominal (+) + obese,   Peds  Hematology  (+) anemia ,   Anesthesia Other Findings   Reproductive/Obstetrics (+) Pregnancy                             Anesthesia Physical Anesthesia Plan  ASA: II and emergent  Anesthesia Plan: Epidural   Post-op Pain Management:    Induction:   Airway Management Planned: Natural Airway  Additional Equipment:   Intra-op Plan:   Post-operative Plan:   Informed Consent: I have reviewed the patients History and Physical, chart, labs and discussed the procedure including the risks, benefits and alternatives for the proposed anesthesia with the patient or authorized representative who has indicated his/her understanding and acceptance.     Plan Discussed with: Anesthesiologist, CRNA and Surgeon  Anesthesia Plan Comments: (C/Section for arrest of descent. Will use epidural for C/Section.)      Anesthesia Quick Evaluation

## 2015-10-20 NOTE — Progress Notes (Signed)
Subjective:    Megan [name] is a 19 y.o. female being seen today for her obstetrical visit. She is at 4025w6d gestation. Patient reports backache, no bleeding, no cramping, no leaking and occasional contractions. Fetal movement: normal.  Problem List Items Addressed This Visit    None    Visit Diagnoses    Prenatal care, third trimester    -  Primary    Relevant Orders    POCT urinalysis dipstick (Completed)      Patient Active Problem List   Diagnosis Date Noted  . Indication for care in labor or delivery 10/20/2015  . Abdominal pain, left lower quadrant 03/08/2013  . Migraine with status migrainosus 03/07/2013    Objective:    BP 120/80 mmHg  Pulse 91  Temp(Src) 98.5 F (36.9 C)  Wt 203 lb (92.08 kg)  LMP 01/18/2015 FHT:  150 BPM  Uterine Size: size equals dates  Presentation: cephalic  Pelvic Exam:              Dilation: 2cm       Effacement: 50%   Station:  -2     Consistency: soft            Position: posterior   NST: reactive, + accels, no decels, occasional contractions. Cat. 1 tracing.  Assessment:    Pregnancy @ 4225w6d  weeks   Post dates  Reactive NST  Plan:    Postdates management: discussed fetal surveillance and induction, discussed fetal movement, NST reactive. Induction: scheduled for 10/20/15, written information given.  Follow up in 2 weeks postpartum.

## 2015-10-20 NOTE — Anesthesia Procedure Notes (Signed)
Epidural Patient location during procedure: OB Start time: 10/20/2015 8:17 PM  Staffing Anesthesiologist: Mal AmabileFOSTER, Paytan Recine Performed by: anesthesiologist   Preanesthetic Checklist Completed: patient identified, site marked, surgical consent, pre-op evaluation, timeout performed, IV checked, risks and benefits discussed and monitors and equipment checked  Epidural Patient position: sitting Prep: site prepped and draped and DuraPrep Patient monitoring: continuous pulse ox and blood pressure Approach: midline Location: L3-L4 Injection technique: LOR air  Needle:  Needle type: Tuohy  Needle gauge: 17 G Needle length: 9 cm and 9 Needle insertion depth: 6 cm Catheter type: closed end flexible Catheter size: 19 Gauge Catheter at skin depth: 11 cm Test dose: negative and Other  Assessment Events: blood not aspirated, injection not painful, no injection resistance, negative IV test and no paresthesia  Additional Notes Patient identified. Risks and benefits discussed including failed block, incomplete  Pain control, post dural puncture headache, nerve damage, paralysis, blood pressure Changes, nausea, vomiting, reactions to medications-both toxic and allergic and post Partum back pain. All questions were answered. Patient expressed understanding and wished to proceed. Sterile technique was used throughout procedure. Epidural site was Dressed with sterile barrier dressing. No paresthesias, signs of intravascular injection Or signs of intrathecal spread were encountered.  Patient was more comfortable after the epidural was dosed. Please see RN's note for documentation of vital signs and FHR which are stable.

## 2015-10-20 NOTE — H&P (Signed)
Megan [name] is a 19 y.o. female presenting for IOL for postdates.  History OB History    Gravida Para Term Preterm AB TAB SAB Ectopic Multiple Living   1              Past Medical History  Diagnosis Date  . Migraines   . GERD (gastroesophageal reflux disease)   . Gallstones    Past Surgical History  Procedure Laterality Date  . Tumor removal      L foot   Family History: family history includes Depression in her father; Diabetes in her father; Heart disease in her father; Hypertension in her father; Stroke in her father. Social History:  reports that she has never smoked. She does not have any smokeless tobacco history on file. She reports that she does not drink alcohol or use illicit drugs.   Prenatal Transfer Tool  Maternal Diabetes: No Genetic Screening: Normal Maternal Ultrasounds/Referrals: Normal Fetal Ultrasounds or other Referrals:  None Maternal Substance Abuse:  No Significant Maternal Medications:  None Significant Maternal Lab Results:  None Other Comments:  None  ROS    Last menstrual period 01/18/2015. Exam Physical Exam  Prenatal labs: ABO, Rh: A/POS/-- (06/10 1511) Antibody: NEG (06/10 1511) Rubella: 0.79 (06/10 1511) RPR: NON REAC (09/27 1339)  HBsAg: NEGATIVE (06/10 1511)  HIV: NONREACTIVE (09/27 1339)  GBS: NOT DETECTED (11/22 1509)   Assessment/Plan: IOL for postdates.  Cytotec and foley bulb.  IVP pain medications until in active labor.  Planning epidural.    Rachelle A Denney 10/20/2015, 9:51 AM

## 2015-10-20 NOTE — Progress Notes (Signed)
Megan [name] is a 19 y.o. G1P0 at 2058w6d by LMP admitted for induction of labor due to Post dates. Due date 10-14-15.  Subjective:   Objective: LMP 01/18/2015      FHT:  FHR: 150 bpm, variability: moderate,  accelerations:  Present,  decelerations:  Absent UC:   regular, every 2-4 minutes SVE:   Dilation: 7.5 Effacement (%): 100 Station: -1 Exam by:: m wilkins rnc  Labs: Lab Results  Component Value Date   WBC 11.0* 10/20/2015   HGB 10.8* 10/20/2015   HCT 33.1* 10/20/2015   MCV 89.5 10/20/2015   PLT 274 10/20/2015    Assessment / Plan: Induction of labor due to postterm,  progressing well on pitocin  Labor: Progressing normally Preeclampsia:  n/a Fetal Wellbeing:  Category I Pain Control:  Nubain I/D:  n/a Anticipated MOD:  NSVD  HARPER,CHARLES A 10/20/2015, 6:38 PM

## 2015-10-20 NOTE — H&P (Signed)
Megan [name] is a 19 y.o. female presenting for IOL for postdates. Maternal Medical History:  Fetal activity: Perceived fetal activity is normal.   Last perceived fetal movement was within the past hour.    Prenatal complications: no prenatal complications Prenatal Complications - Diabetes: none.    OB History    Gravida Para Term Preterm AB TAB SAB Ectopic Multiple Living   1              Past Medical History  Diagnosis Date  . Migraines   . GERD (gastroesophageal reflux disease)   . Gallstones    Past Surgical History  Procedure Laterality Date  . Tumor removal      L foot   Family History: family history includes Depression in her father; Diabetes in her father; Heart disease in her father; Hypertension in her father; Stroke in her father. Social History:  reports that she has never smoked. She does not have any smokeless tobacco history on file. She reports that she does not drink alcohol or use illicit drugs.   Prenatal Transfer Tool  Maternal Diabetes: No Genetic Screening: Normal Maternal Ultrasounds/Referrals: Normal Fetal Ultrasounds or other Referrals:  None Maternal Substance Abuse:  No Significant Maternal Medications:  None Significant Maternal Lab Results:  None Other Comments:  None  Review of Systems  All other systems reviewed and are negative.     Last menstrual period 01/18/2015. Maternal Exam:  Abdomen: Patient reports no abdominal tenderness. Fetal presentation: vertex  Introitus: Normal vulva. Normal vagina.  Cervix: Cervix evaluated by digital exam.     Physical Exam  Nursing note and vitals reviewed. Constitutional: She is oriented to person, place, and time. She appears well-developed and well-nourished.  HENT:  Head: Normocephalic and atraumatic.  Eyes: Conjunctivae are normal. Pupils are equal, round, and reactive to light.  Neck: Normal range of motion. Neck supple.  Cardiovascular: Normal rate and regular rhythm.    Respiratory: Effort normal and breath sounds normal.  GI: Soft. Bowel sounds are normal.  Genitourinary: Vagina normal and uterus normal.  Musculoskeletal: Normal range of motion.  Neurological: She is alert and oriented to person, place, and time.  Skin: Skin is warm and dry.  Psychiatric: She has a normal mood and affect. Her behavior is normal. Judgment and thought content normal.    Prenatal labs: ABO, Rh: A/POS/-- (06/10 1511) Antibody: NEG (06/10 1511) Rubella: 0.79 (06/10 1511) RPR: NON REAC (09/27 1339)  HBsAg: NEGATIVE (06/10 1511)  HIV: NONREACTIVE (09/27 1339)  GBS: NOT DETECTED (11/22 1509)   Assessment/Plan: Postdates.  2 stage IOL.   HARPER,CHARLES A 10/20/2015, 9:12 AM

## 2015-10-21 ENCOUNTER — Encounter (HOSPITAL_COMMUNITY): Payer: Self-pay

## 2015-10-21 ENCOUNTER — Inpatient Hospital Stay (HOSPITAL_COMMUNITY): Admission: RE | Admit: 2015-10-21 | Payer: Medicaid Other | Source: Ambulatory Visit

## 2015-10-21 ENCOUNTER — Encounter (HOSPITAL_COMMUNITY)
Admission: RE | Disposition: A | Discharge status: Home / Self Care | Payer: Self-pay | Source: Ambulatory Visit | Attending: Obstetrics

## 2015-10-21 DIAGNOSIS — Z98891 History of uterine scar from previous surgery: Secondary | ICD-10-CM

## 2015-10-21 LAB — RPR: RPR: NONREACTIVE

## 2015-10-21 SURGERY — Surgical Case
Anesthesia: Epidural

## 2015-10-21 MED ORDER — LACTATED RINGERS IV SOLN: INTRAVENOUS | Status: DC

## 2015-10-21 MED ORDER — DIBUCAINE 1 % RE OINT: 1.0000 "application " | TOPICAL_OINTMENT | RECTAL | Status: DC | PRN

## 2015-10-21 MED ORDER — OXYTOCIN 40 UNITS IN LACTATED RINGERS INFUSION - SIMPLE MED: 62.5000 mL/h | INTRAVENOUS | Status: AC

## 2015-10-21 MED ORDER — OXYTOCIN 10 UNIT/ML IJ SOLN
40.0000 [IU] | INTRAVENOUS | Status: DC | PRN
  Administered 2015-10-21: 40 [IU] via INTRAVENOUS

## 2015-10-21 MED ORDER — SENNOSIDES-DOCUSATE SODIUM 8.6-50 MG PO TABS
2.0000 | ORAL_TABLET | ORAL | Status: DC
  Administered 2015-10-22 (×2): 2 via ORAL
  Filled 2015-10-21 (×2): qty 2

## 2015-10-21 MED ORDER — CEFAZOLIN SODIUM-DEXTROSE 2-3 GM-% IV SOLR
INTRAVENOUS | Status: DC | PRN
  Administered 2015-10-21: 2 g via INTRAVENOUS

## 2015-10-21 MED ORDER — CEFAZOLIN SODIUM-DEXTROSE 2-3 GM-% IV SOLR
INTRAVENOUS | Status: AC
  Filled 2015-10-21: qty 50

## 2015-10-21 MED ORDER — KETOROLAC TROMETHAMINE 30 MG/ML IJ SOLN
INTRAMUSCULAR | Status: AC
  Filled 2015-10-21: qty 1

## 2015-10-21 MED ORDER — ONDANSETRON HCL 4 MG/2ML IJ SOLN
INTRAMUSCULAR | Status: DC | PRN
  Administered 2015-10-21: 4 mg via INTRAVENOUS

## 2015-10-21 MED ORDER — PHENYLEPHRINE 40 MCG/ML (10ML) SYRINGE FOR IV PUSH (FOR BLOOD PRESSURE SUPPORT)
PREFILLED_SYRINGE | INTRAVENOUS | Status: AC
Start: 2015-10-21 — End: 2015-10-21
  Filled 2015-10-21: qty 10

## 2015-10-21 MED ORDER — MORPHINE SULFATE (PF) 0.5 MG/ML IJ SOLN
INTRAMUSCULAR | Status: AC
Start: 2015-10-21 — End: 2015-10-21
  Filled 2015-10-21: qty 10

## 2015-10-21 MED ORDER — SIMETHICONE 80 MG PO CHEW: 80.0000 mg | CHEWABLE_TABLET | ORAL | Status: DC | PRN

## 2015-10-21 MED ORDER — DIPHENHYDRAMINE HCL 50 MG/ML IJ SOLN: 12.5000 mg | INTRAMUSCULAR | Status: DC | PRN

## 2015-10-21 MED ORDER — DIPHENHYDRAMINE HCL 25 MG PO CAPS: 25.0000 mg | ORAL_CAPSULE | ORAL | Status: DC | PRN

## 2015-10-21 MED ORDER — INFLUENZA VAC SPLIT QUAD 0.5 ML IM SUSY
0.5000 mL | PREFILLED_SYRINGE | INTRAMUSCULAR | Status: DC
  Filled 2015-10-21: qty 0.5

## 2015-10-21 MED ORDER — KETOROLAC TROMETHAMINE 30 MG/ML IJ SOLN: 30.0000 mg | Freq: Four times a day (QID) | INTRAMUSCULAR | Status: DC | PRN

## 2015-10-21 MED ORDER — PHENYLEPHRINE 8 MG IN D5W 100 ML (0.08MG/ML) PREMIX OPTIME
INJECTION | INTRAVENOUS | Status: AC
Start: 2015-10-21 — End: 2015-10-21
  Filled 2015-10-21: qty 100

## 2015-10-21 MED ORDER — OXYCODONE-ACETAMINOPHEN 5-325 MG PO TABS
2.0000 | ORAL_TABLET | ORAL | Status: DC | PRN
  Administered 2015-10-22 – 2015-10-23 (×2): 2 via ORAL
  Filled 2015-10-21 (×2): qty 2

## 2015-10-21 MED ORDER — FENTANYL CITRATE (PF) 100 MCG/2ML IJ SOLN: 25.0000 ug | INTRAMUSCULAR | Status: DC | PRN

## 2015-10-21 MED ORDER — MEASLES, MUMPS & RUBELLA VAC ~~LOC~~ INJ
0.5000 mL | INJECTION | Freq: Once | SUBCUTANEOUS | Status: AC
  Administered 2015-10-23: 0.5 mL via SUBCUTANEOUS
  Filled 2015-10-21: qty 0.5

## 2015-10-21 MED ORDER — SCOPOLAMINE 1 MG/3DAYS TD PT72
MEDICATED_PATCH | TRANSDERMAL | Status: AC
  Filled 2015-10-21: qty 1

## 2015-10-21 MED ORDER — ONDANSETRON HCL 4 MG/2ML IJ SOLN
INTRAMUSCULAR | Status: AC
  Filled 2015-10-21: qty 2

## 2015-10-21 MED ORDER — ZOLPIDEM TARTRATE 5 MG PO TABS: 5.0000 mg | ORAL_TABLET | Freq: Every evening | ORAL | Status: DC | PRN

## 2015-10-21 MED ORDER — PHENYLEPHRINE 8 MG IN D5W 100 ML (0.08MG/ML) PREMIX OPTIME
INJECTION | INTRAVENOUS | Status: DC | PRN
  Administered 2015-10-21: 60 ug/min via INTRAVENOUS

## 2015-10-21 MED ORDER — NALBUPHINE HCL 10 MG/ML IJ SOLN: 5.0000 mg | INTRAMUSCULAR | Status: DC | PRN

## 2015-10-21 MED ORDER — LACTATED RINGERS IV SOLN
INTRAVENOUS | Status: DC | PRN
  Administered 2015-10-21: 06:00:00 via INTRAVENOUS

## 2015-10-21 MED ORDER — OXYCODONE-ACETAMINOPHEN 5-325 MG PO TABS
1.0000 | ORAL_TABLET | ORAL | Status: DC | PRN
  Administered 2015-10-22 – 2015-10-23 (×3): 1 via ORAL
  Filled 2015-10-21 (×3): qty 1

## 2015-10-21 MED ORDER — LANOLIN HYDROUS EX OINT: 1.0000 "application " | TOPICAL_OINTMENT | CUTANEOUS | Status: DC | PRN

## 2015-10-21 MED ORDER — NALOXONE HCL 2 MG/2ML IJ SOSY
1.0000 ug/kg/h | PREFILLED_SYRINGE | INTRAVENOUS | Status: DC | PRN
  Filled 2015-10-21: qty 2

## 2015-10-21 MED ORDER — MEPERIDINE HCL 25 MG/ML IJ SOLN
INTRAMUSCULAR | Status: AC
  Filled 2015-10-21: qty 1

## 2015-10-21 MED ORDER — DIPHENHYDRAMINE HCL 25 MG PO CAPS: 25.0000 mg | ORAL_CAPSULE | Freq: Four times a day (QID) | ORAL | Status: DC | PRN

## 2015-10-21 MED ORDER — NALBUPHINE HCL 10 MG/ML IJ SOLN: 5.0000 mg | Freq: Once | INTRAMUSCULAR | Status: DC | PRN

## 2015-10-21 MED ORDER — SODIUM BICARBONATE 8.4 % IV SOLN
INTRAVENOUS | Status: DC | PRN
  Administered 2015-10-21 (×2): 5 mL via EPIDURAL

## 2015-10-21 MED ORDER — WITCH HAZEL-GLYCERIN EX PADS: 1.0000 "application " | MEDICATED_PAD | CUTANEOUS | Status: DC | PRN

## 2015-10-21 MED ORDER — IBUPROFEN 600 MG PO TABS
600.0000 mg | ORAL_TABLET | Freq: Four times a day (QID) | ORAL | Status: DC
  Administered 2015-10-21 – 2015-10-23 (×7): 600 mg via ORAL
  Filled 2015-10-21 (×7): qty 1

## 2015-10-21 MED ORDER — SODIUM CHLORIDE 0.9 % IJ SOLN: 3.0000 mL | INTRAMUSCULAR | Status: DC | PRN

## 2015-10-21 MED ORDER — ACETAMINOPHEN 325 MG PO TABS: 650.0000 mg | ORAL_TABLET | ORAL | Status: DC | PRN

## 2015-10-21 MED ORDER — SCOPOLAMINE 1 MG/3DAYS TD PT72
MEDICATED_PATCH | TRANSDERMAL | Status: DC | PRN
  Administered 2015-10-21: 1 via TRANSDERMAL

## 2015-10-21 MED ORDER — INFLUENZA VAC SPLIT QUAD 0.5 ML IM SUSY
0.5000 mL | PREFILLED_SYRINGE | INTRAMUSCULAR | Status: AC
  Administered 2015-10-22: 0.5 mL via INTRAMUSCULAR

## 2015-10-21 MED ORDER — NALOXONE HCL 0.4 MG/ML IJ SOLN: 0.4000 mg | INTRAMUSCULAR | Status: DC | PRN

## 2015-10-21 MED ORDER — SIMETHICONE 80 MG PO CHEW
80.0000 mg | CHEWABLE_TABLET | ORAL | Status: DC
  Administered 2015-10-22 (×2): 80 mg via ORAL
  Filled 2015-10-21 (×2): qty 1

## 2015-10-21 MED ORDER — MEPERIDINE HCL 25 MG/ML IJ SOLN
INTRAMUSCULAR | Status: DC | PRN
  Administered 2015-10-21 (×2): 12.5 mg via INTRAVENOUS

## 2015-10-21 MED ORDER — ONDANSETRON HCL 4 MG/2ML IJ SOLN: 4.0000 mg | Freq: Three times a day (TID) | INTRAMUSCULAR | Status: DC | PRN

## 2015-10-21 MED ORDER — SIMETHICONE 80 MG PO CHEW
80.0000 mg | CHEWABLE_TABLET | Freq: Three times a day (TID) | ORAL | Status: DC
  Administered 2015-10-21 – 2015-10-23 (×3): 80 mg via ORAL
  Filled 2015-10-21 (×3): qty 1

## 2015-10-21 MED ORDER — KETOROLAC TROMETHAMINE 30 MG/ML IJ SOLN
30.0000 mg | Freq: Four times a day (QID) | INTRAMUSCULAR | Status: DC | PRN
  Administered 2015-10-21: 30 mg via INTRAMUSCULAR

## 2015-10-21 MED ORDER — PRENATAL MULTIVITAMIN CH
1.0000 | ORAL_TABLET | Freq: Every day | ORAL | Status: DC
  Administered 2015-10-22 – 2015-10-23 (×2): 1 via ORAL
  Filled 2015-10-21 (×2): qty 1

## 2015-10-21 MED ORDER — TETANUS-DIPHTH-ACELL PERTUSSIS 5-2.5-18.5 LF-MCG/0.5 IM SUSP
0.5000 mL | Freq: Once | INTRAMUSCULAR | Status: AC
  Administered 2015-10-22: 0.5 mL via INTRAMUSCULAR

## 2015-10-21 MED ORDER — OXYTOCIN 10 UNIT/ML IJ SOLN
INTRAMUSCULAR | Status: AC
  Filled 2015-10-21: qty 4

## 2015-10-21 MED ORDER — MEPERIDINE HCL 25 MG/ML IJ SOLN
6.2500 mg | INTRAMUSCULAR | Status: DC | PRN
Start: 2015-10-21 — End: 2015-10-21

## 2015-10-21 MED ORDER — MENTHOL 3 MG MT LOZG: 1.0000 | LOZENGE | OROMUCOSAL | Status: DC | PRN

## 2015-10-21 MED ORDER — MORPHINE SULFATE (PF) 0.5 MG/ML IJ SOLN
INTRAMUSCULAR | Status: DC | PRN
  Administered 2015-10-21: 1 mg via INTRAVENOUS
  Administered 2015-10-21: 4 mg via EPIDURAL

## 2015-10-21 MED ORDER — DEXAMETHASONE SODIUM PHOSPHATE 10 MG/ML IJ SOLN
INTRAMUSCULAR | Status: AC
  Filled 2015-10-21: qty 1

## 2015-10-21 MED ORDER — IBUPROFEN 600 MG PO TABS
600.0000 mg | ORAL_TABLET | Freq: Four times a day (QID) | ORAL | Status: DC | PRN
  Administered 2015-10-22: 600 mg via ORAL
  Filled 2015-10-21: qty 1

## 2015-10-21 MED ORDER — DEXAMETHASONE SODIUM PHOSPHATE 10 MG/ML IJ SOLN
INTRAMUSCULAR | Status: DC | PRN
  Administered 2015-10-21: 10 mg via INTRAVENOUS

## 2015-10-21 SURGICAL SUPPLY — 38 items
CLAMP CORD UMBIL (MISCELLANEOUS) IMPLANT
CLOTH BEACON ORANGE TIMEOUT ST (SAFETY) ×3 IMPLANT
CONTAINER PREFILL 10% NBF 15ML (MISCELLANEOUS) ×6 IMPLANT
DRAPE SHEET LG 3/4 BI-LAMINATE (DRAPES) IMPLANT
DRSG OPSITE POSTOP 4X10 (GAUZE/BANDAGES/DRESSINGS) ×3 IMPLANT
DURAPREP 26ML APPLICATOR (WOUND CARE) ×3 IMPLANT
ELECT REM PT RETURN 9FT ADLT (ELECTROSURGICAL) ×3
ELECTRODE REM PT RTRN 9FT ADLT (ELECTROSURGICAL) ×1 IMPLANT
EXTRACTOR VACUUM M CUP 4 TUBE (SUCTIONS) IMPLANT
EXTRACTOR VACUUM M CUP 4' TUBE (SUCTIONS)
GLOVE BIO SURGEON STRL SZ8 (GLOVE) ×3 IMPLANT
GLOVE BIOGEL PI IND STRL 7.0 (GLOVE) ×1 IMPLANT
GLOVE BIOGEL PI INDICATOR 7.0 (GLOVE) ×2
GOWN STRL REUS W/TWL LRG LVL3 (GOWN DISPOSABLE) ×6 IMPLANT
KIT ABG SYR 3ML LUER SLIP (SYRINGE) IMPLANT
LIQUID BAND (GAUZE/BANDAGES/DRESSINGS) ×3 IMPLANT
NDL HYPO 25X5/8 SAFETYGLIDE (NEEDLE) ×1 IMPLANT
NEEDLE HYPO 22GX1.5 SAFETY (NEEDLE) ×3 IMPLANT
NEEDLE HYPO 25X5/8 SAFETYGLIDE (NEEDLE) ×3 IMPLANT
NS IRRIG 1000ML POUR BTL (IV SOLUTION) ×3 IMPLANT
PACK C SECTION WH (CUSTOM PROCEDURE TRAY) ×3 IMPLANT
PAD OB MATERNITY 4.3X12.25 (PERSONAL CARE ITEMS) ×3 IMPLANT
PENCIL SMOKE EVAC W/HOLSTER (ELECTROSURGICAL) ×3 IMPLANT
RTRCTR C-SECT PINK 25CM LRG (MISCELLANEOUS) ×3 IMPLANT
STAPLER VISISTAT 35W (STAPLE) ×2 IMPLANT
SUT GUT PLAIN 0 CT-3 TAN 27 (SUTURE) IMPLANT
SUT MNCRL 0 VIOLET CTX 36 (SUTURE) ×3 IMPLANT
SUT MNCRL AB 4-0 PS2 18 (SUTURE) IMPLANT
SUT MON AB 2-0 CT1 27 (SUTURE) ×3 IMPLANT
SUT MON AB 3-0 SH 27 (SUTURE)
SUT MON AB 3-0 SH27 (SUTURE) IMPLANT
SUT MONOCRYL 0 CTX 36 (SUTURE) ×6
SUT PLAIN 2 0 XLH (SUTURE) IMPLANT
SUT VIC AB 0 CTX 36 (SUTURE) ×6
SUT VIC AB 0 CTX36XBRD ANBCTRL (SUTURE) ×2 IMPLANT
SYR CONTROL 10ML LL (SYRINGE) ×3 IMPLANT
TOWEL OR 17X24 6PK STRL BLUE (TOWEL DISPOSABLE) ×3 IMPLANT
TRAY FOLEY CATH SILVER 14FR (SET/KITS/TRAYS/PACK) ×3 IMPLANT

## 2015-10-21 NOTE — Transfer of Care (Signed)
Immediate Anesthesia Transfer of Care Note  Patient: Megan [name]  Procedure(s) Performed: Procedure(s): CESAREAN SECTION (N/A)  Patient Location: PACU  Anesthesia Type:Epidural  Level of Consciousness: awake, alert , oriented and patient cooperative  Airway & Oxygen Therapy: Patient Spontanous Breathing  Post-op Assessment: Report given to RN and Post -op Vital signs reviewed and stable  Post vital signs: Reviewed and stable  Last Vitals:  Filed Vitals:   10/21/15 0432 10/21/15 0503  BP: 109/65 113/86  Pulse: 100 191  Temp:  36.7 C  Resp:  16    Complications: No apparent anesthesia complications

## 2015-10-21 NOTE — Addendum Note (Signed)
Addendum  created 10/21/15 1242 by Shanon PayorSuzanne M Forest Redwine, CRNA   Modules edited: Clinical Notes   Clinical Notes:  File: [id number]

## 2015-10-21 NOTE — Anesthesia Postprocedure Evaluation (Signed)
Anesthesia Post Note  Patient: Megan [name]  Procedure(s) Performed: Procedure(s) (LRB): CESAREAN SECTION (N/A)  Patient location during evaluation: Mother Baby Anesthesia Type: Epidural Level of consciousness: awake and alert and oriented Pain management: pain level controlled Vital Signs Assessment: post-procedure vital signs reviewed and stable Respiratory status: spontaneous breathing and nonlabored ventilation Cardiovascular status: stable Postop Assessment: no headache, no backache, patient able to bend at knees, no signs of nausea or vomiting and adequate PO intake Anesthetic complications: no    Last Vitals:  Filed Vitals:   10/21/15 0935 10/21/15 1036  BP: 114/46 119/63  Pulse: 83 77  Temp: 37.4 C 36.3 C  Resp: 18 18    Last Pain:  Filed Vitals:   10/21/15 1038  PainSc: 0-No pain                 Briauna Gilmartin

## 2015-10-21 NOTE — Progress Notes (Signed)
Megan [name] is [name] 19 y.o. G1P0 at 5064w0d by LMP admitted for induction of labor due to Post dates. Due date 10-14-15.  Subjective:   Objective: BP 109/65 mmHg  Pulse 100  Temp(Src) 98.2 F (36.8 C) (Axillary)  Resp 18  Ht 5\' 2"  (1.575 m)  Wt 203 lb (92.08 kg)  BMI 37.12 kg/m2  SpO2 97%  LMP 01/18/2015      FHT:  FHR: 150 bpm, variability: minimal ,  accelerations:  Present,  decelerations:  Absent UC:   regular, every 2-3 minutes SVE:   Dilation: 9 Effacement (%): 100 Station: -1 Exam by:: Rushie GoltzN. King, RN/L. Mears, RN  Labs: Lab Results  Component Value Date   WBC 11.0* 10/20/2015   HGB 10.8* 10/20/2015   HCT 33.1* 10/20/2015   MCV 89.5 10/20/2015   PLT 274 10/20/2015    Assessment / Plan: No progress in descent for > 5 hours.  Will proceed with C/S for arrest of descent.  Labor: No progress in descent for > 5 hours Preeclampsia:  n/[name] Fetal Wellbeing:  Category I Pain Control:  Epidural I/D:  n/[name] Anticipated MOD:  NSVD  Megan [name] 10/21/2015, 5:22 AM

## 2015-10-21 NOTE — Anesthesia Postprocedure Evaluation (Signed)
Anesthesia Post Note  Patient: [name]  Procedure(s) Performed: Procedure(s) (LRB): CESAREAN SECTION (N/A)  Patient location during evaluation: PACU Anesthesia Type: Epidural Level of consciousness: awake and alert and oriented Pain management: pain level controlled Vital Signs Assessment: post-procedure vital signs reviewed and stable Respiratory status: spontaneous breathing, nonlabored ventilation and respiratory function stable Cardiovascular status: blood pressure returned to baseline and stable Postop Assessment: no signs of nausea or vomiting, patient able to bend at knees, epidural receding, no backache and no headache Anesthetic complications: no    Last Vitals:  Filed Vitals:   10/21/15 0730 10/21/15 0745  BP: 109/62   Pulse: 100 99  Temp: 36.6 C   Resp: 22 19    Last Pain:  Filed Vitals:   10/21/15 0749  PainSc: 5                  Claryce Friel A.

## 2015-10-21 NOTE — Lactation Note (Signed)
This note was copied from the chart of Megan [name]. Lactation Consultation Note  Patient Name: Megan [name]: 10/21/2015 Reason for consult: Initial assessment  Baby 7 hours old. Parents report that baby spit up amniotic fluid earlier and has not wanted to latch since. Enc mom to offer lots of STS as she is able so that baby will smell her and want to latch. Enc FOB to her high on his shoulder and pat her back to help the remaining fluid drain well. Enc mom to hand express and give baby drops of colostrum as well. [facility], aware of OP/BFSG and LC phone line assistance after D/C.  Maternal Data    Feeding    LATCH Score/Interventions                      Lactation Tools Discussed/Used     Consult Status Consult Status: Follow-up [name]: 10/22/15 Follow-up type: In-patient    Geralynn OchsWILLIARD, Lonnette Shrode 10/21/2015, 1:27 PM

## 2015-10-21 NOTE — Op Note (Signed)
Cesarean Section Procedure Note   Megan [name]   10/20/2015 - 10/21/2015  Indications: Arrest of Descent   Pre-operative Diagnosis: Arrest of Descent.   Post-operative Diagnosis: Same   Surgeon: HARPER,CHARLES A  Assistants: Surgical Assistant  Anesthesia: epidural  Procedure Details:  The patient was seen in the Holding Room. The risks, benefits, complications, treatment options, and expected outcomes were discussed with the patient. The patient concurred with the proposed plan, giving informed consent. The patient was identified as Megan AusNaya N Rudge and the procedure verified as C-Section Delivery. A Time Out was held and the above information confirmed.  After induction of anesthesia, the patient was draped and prepped in the usual sterile manner. A transverse incision was made and carried down through the subcutaneous tissue to the fascia. The fascial incision was made and extended transversely. The fascia was separated from the underlying rectus tissue superiorly and inferiorly. The peritoneum was identified and entered. The peritoneal incision was extended longitudinally. The utero-vesical peritoneal reflection was incised transversely and the bladder flap was bluntly freed from the lower uterine segment. A low transverse uterine incision was made. Delivered from cephalic presentation was a 3560 gram living newborn female infant(s). APGAR (1 MIN): 9   APGAR (5 MINS): 9   APGAR (10 MINS):    A cord ph was not sent. The umbilical cord was clamped and cut cord. A sample was obtained for evaluation. The placenta was removed Intact and appeared normal.  The uterine incision was closed with running locked sutures of 0 Monocryl. A second imbricating layer of the same suture was placed.  Hemostasis was observed. The paracolic gutters were irrigated. The parieto peritoneum was closed in a running fashion with 2-0 Vicryl.  The fascia was then reapproximated with running sutures of 0 Vicryl.  The  skin was closed with staples.  Instrument, sponge, and needle counts were correct prior the abdominal closure and were correct at the conclusion of the case.    Findings:  Normal uterus, ovaries and tubes   Estimated Blood Loss: 1000ml  Total IV Fluids: 1500ml   Urine Output: 200CC OF pink colored urine  Specimens: Placenta to pathology  Complications: no complications  Disposition: PACU - hemodynamically stable.  Maternal Condition: stable   Baby condition / location:  Couplet care / Skin to Skin    Signed: Surgeon(s): Brock Badharles A Harper, MD

## 2015-10-22 ENCOUNTER — Encounter (HOSPITAL_COMMUNITY): Payer: Self-pay | Admitting: Obstetrics

## 2015-10-22 LAB — CBC
HEMATOCRIT: 22.4 % — AB (ref 36.0–46.0)
Hemoglobin: 7.4 g/dL — ABNORMAL LOW (ref 12.0–15.0)
MCH: 30.6 pg (ref 26.0–34.0)
MCHC: 33 g/dL (ref 30.0–36.0)
MCV: 92.6 fL (ref 78.0–100.0)
Platelets: 216 10*3/uL (ref 150–400)
RBC: 2.42 MIL/uL — ABNORMAL LOW (ref 3.87–5.11)
RDW: 13.8 % (ref 11.5–15.5)
WBC: 14.7 10*3/uL — AB (ref 4.0–10.5)

## 2015-10-22 MED ORDER — FERROUS SULFATE 325 (65 FE) MG PO TABS
325.0000 mg | ORAL_TABLET | Freq: Three times a day (TID) | ORAL | Status: DC
  Administered 2015-10-22 – 2015-10-23 (×3): 325 mg via ORAL
  Filled 2015-10-22 (×3): qty 1

## 2015-10-22 NOTE — Lactation Note (Signed)
This note was copied from the chart of Megan Deena Blowers. Lactation Consultation Note  Patient Name: Megan [name] GNFAO'ZToday's Date: 10/22/2015     Maternal Data    Feeding    LATCH Score/Interventions                      Lactation Tools Discussed/Used     Consult Status      Alfred LevinsLee, Shir Bergman Anne 10/22/2015, 3:53 PM

## 2015-10-22 NOTE — Lactation Note (Signed)
This note was copied from the chart of Megan Meadow Rueger. Lactation Consultation Note  Patient Name: Megan [name] ZOXWR'UToday's Date: 10/22/2015 Reason for consult: Follow-up assessment   With this mom of a term baby, now 932 hours old. Mom has been having trouble getting him to latch. She did breast feed this morning once, latched with the help of mom's nurse, Moira, and baby fed well for 20 minutes. I was not able to wake the baby to latch. I did not a mid posterior short frenulum, and a heart shaped tongue, that forms a cup with elevation. I fitted mom with a 20 nipple shield, and again tried to latch the baby, but he was asleep. Mom pumped, and expressed 23 ml's of transitional milk/colostrum, and bottle fed this to the baby. He tolerated this well.  [facility] [place] [facility] and set up an apointment to apply, if needed. Mom will do a 12 day WIC loaner at discharge - paper work not given to mom yet. Mom knows to call for questions/concerns.     Maternal Data    Feeding Feeding Type: Breast Fed  LATCH Score/Interventions Latch: Too sleepy or reluctant, no latch achieved, no sucking elicited.                    Lactation Tools Discussed/Used WIC Program: Yes [place] Pump Review: Setup, frequency, and cleaning;Milk Storage;Other (comment) (mom now using maintenance pump setting)   Consult Status Consult Status: Follow-up Date: 10/23/15 Follow-up type: In-patient    Alfred LevinsLee, Hermon Zea Anne 10/22/2015, 4:34 PM

## 2015-10-22 NOTE — Progress Notes (Signed)
Subjective: Postpartum Day #1: Cesarean Delivery Patient reports incisional pain, tolerating PO and no problems voiding.  States breastfeeding is going well.    Objective: Vital signs in last 24 hours: Temp:  [97.3 F (36.3 C)-99.3 F (37.4 C)] 98.3 F (36.8 C) (12/30 0510) Pulse Rate:  [62-90] 90 (12/30 0510) Resp:  [14-20] 14 (12/30 0510) BP: (95-122)/(34-63) 99/53 mmHg (12/30 0510) SpO2:  [95 %-100 %] 100 % (12/30 0510)  Physical Exam:  General: alert, cooperative and no distress Lochia: appropriate Uterine Fundus: firm Incision: healing well, no significant drainage DVT Evaluation: No evidence of DVT seen on physical exam. No cords or calf tenderness. No significant calf/ankle edema.   Recent Labs  10/20/15 0755 10/22/15 0545  HGB 10.8* 7.4*  HCT 33.1* 22.4*    Assessment/Plan: Status post Cesarean section. Doing well postoperatively.  Anemic Continue current care.  Roe Coombsachelle A Rozell Kettlewell, CNM 10/22/2015, 8:15 AM

## 2015-10-23 NOTE — Progress Notes (Signed)
Patient ID: Megan [name], female   DOB: [birth year], 19 y.o.   MRN: [id number] Blood pressure 109/59 afebrile pulse 83 respirations 16 Abdomen soft passing flatus legs negative doing well wants early discharge

## 2015-10-23 NOTE — Progress Notes (Signed)
Patient discharged home with significant other... Discharge instructions reviewed with patient and she verbalized understanding... Patient aware of appointment on Tuesday to have staples removed... Condition stable... No equipment... Taken to car via wheelchair by P. Reola CalkinsBeck, NT.

## 2015-10-23 NOTE — Discharge Summary (Signed)
Obstetric Discharge Summary Reason for Admission: onset of labor Prenatal Procedures: none Intrapartum Procedures: cesarean: low cervical, transverse Postpartum Procedures: none Complications-Operative and Postpartum: none HEMOGLOBIN  Date Value Ref Range Status  10/22/2015 7.4* 12.0 - 15.0 g/dL Final    Comment:    REPEATED TO VERIFY DELTA CHECK NOTED    HCT  Date Value Ref Range Status  10/22/2015 22.4* 36.0 - 46.0 % Final    Physical Exam:  General: alert Lochia: appropriate Uterine Fundus: firm Incision: healing well DVT Evaluation: No evidence of DVT seen on physical exam.  Discharge Diagnoses: Term Pregnancy-delivered  Discharge Information: Date: 10/23/2015 Activity: pelvic rest Diet: routine Medications: Percocet Condition: stable Instructions: refer to practice specific booklet Discharge to: home Follow-up Information    Follow up with HARPER,CHARLES A, MD.   Specialty:  Obstetrics and Gynecology   Contact information:   [address] [place] [address] [phone]       Newborn Data: Live born female  Birth Weight: 7 lb 13.6 oz (3560 g) APGAR: 9, 9  Home with mother.  Yandel Zeiner A 10/23/2015, 6:59 AM

## 2015-10-23 NOTE — Discharge Instructions (Signed)
Discharge instructions   You can wash your hair  Shower  Eat what you want  Drink what you want  See me in 6 weeks  Your ankles are going to swell more in the next 2 weeks than when pregnant  No sex for 6 weeks   Willia Lampert A, MD 10/23/2015

## 2015-10-23 NOTE — Lactation Note (Signed)
This note was copied from the chart of Megan [name]. Lactation Consultation Note  Patient Name: Megan Mertie Clauseaya Karas ZOXWR'UToday's Date: 10/23/2015 Reason for consult: Follow-up assessment Baby is 3055 hours old - per mom baby has been to the breast with a #20 Nipple shield  And per mom noted EBM in the NS after wards. Per mom has pumped x2 since last night and  Last pumped early this am.  Baby acting hungry - LC offered to assist with latch , #20 NS still a good fit.  Baby latched with depth and multiply swallows noted, increased with breast compressions.  Baby fed 10 mins. And dad supplemented with EBM afterwards while mom was pumping both breast.  Mom pumped off close to 60 ml. Per mom breast are more comfortable.  Per mom has DEBP at home . LC reviewed supply and demand and the importance to protect milk supply.  After mom post pumped LC rechecked NS size , #20 NS to small , #24 NS fit better. LC explained to mom depending how full  Mom is will depend wheat size she will be using.  Sore nipple and engorgement prevention and tx reviewed.   F/U O/P appt. Made for January 10/29/15 at 4pm due ( late in the day due to dad's schedule and mom can't drive for 2 weeks.   Maternal Data Has patient been taught Hand Expression?: Yes  Feeding Feeding Type: Breast Milk Length of feed: 10 min  LATCH Score/Interventions Latch: Grasps breast easily, tongue down, lips flanged, rhythmical sucking. Intervention(s): Skin to skin;Teach feeding cues;Waking techniques Intervention(s): Adjust position;Assist with latch;Breast massage;Breast compression  Audible Swallowing: Spontaneous and intermittent  Type of Nipple: Everted at rest and after stimulation (semi compressible areolas )  Comfort (Breast/Nipple): Filling, red/small blisters or bruises, mild/mod discomfort     Hold (Positioning): Assistance needed to correctly position infant at breast and maintain latch. Intervention(s): Breastfeeding  basics reviewed  LATCH Score: 8  Lactation Tools Discussed/Used Tools: Pump Nipple shield size: Other (comment);20;24 (re-sized after pumping - #24 fit better ) Flange Size: Other (comment) (previous LC had givne mom 2 #21 flanges ) Breast pump type: Double-Electric Breast Pump (expressed 60 ml ) WIC Program: Yes   Consult Status Consult Status: Follow-up Date: 10/29/15 (4 pm ) Follow-up type: Out-patient    Kathrin Greathouseorio, Lutie Pickler Ann 10/23/2015, 1:31 PM

## 2015-10-26 ENCOUNTER — Encounter: Payer: Self-pay | Admitting: Obstetrics

## 2015-10-26 ENCOUNTER — Ambulatory Visit (INDEPENDENT_AMBULATORY_CARE_PROVIDER_SITE_OTHER): Payer: Medicaid Other | Admitting: Obstetrics

## 2015-10-26 NOTE — Progress Notes (Signed)
Subjective: POD #5 s/p LTC/S  Indication:failure to progress Patient reports tolerating PO.  Denies HA/SOB/C/P/N/V/dizziness.  Reports flatus but no BM since C/S. Breast symptoms: No.  She reports vaginal bleeding as normal, without clots.  She is ambulating, urinating without difficulty.     Objective: Vital signs in last 24 hours: BP 120/80 mmHg  Pulse 91  Wt 190 lb (86.183 kg)  LMP 01/18/2015  Physical Exam:  General: alert and no distress CV: Regular rate and rhythm, S1S2 present or without murmur or extra heart sounds Resp: clear Abdomen: soft, nontender, normal bowel sounds Lochia: moderate Uterine Fundus: firm, below umbilicus, nontender Incision: clean, dry and intact.  Staples removed and steri strips applied to incision. Ext: extremities normal, atraumatic, no cyanosis or edema No results for input(s): HGB, HCT in the last 72 hours.  Assessment/Plan: 20 y.o. status post Cesarean section POD# 5.  normal postpartum exam  Routine post-op care   Toya Palacios A 10/26/2015, 1:46 PM

## 2015-10-29 ENCOUNTER — Ambulatory Visit (HOSPITAL_COMMUNITY)
Admission: RE | Admit: 2015-10-29 | Discharge: 2015-10-29 | Disposition: A | Discharge status: Home / Self Care | Payer: Medicaid Other | Source: Ambulatory Visit | Attending: Obstetrics | Admitting: Obstetrics

## 2015-10-29 NOTE — Lactation Note (Signed)
Lactation Consult: Mom has been pumping and bottle feeding EBM, Not using any formula. Has First years pump and reports she is doing well with it- no pain. [facility] suggested asking [facility] about [facility]. Mom states she is planning to continue pumping and bottle feeding EBM- offered assist with latch but mom pumped just before coming to appointment and says she plans to just keep pumping. Baby has strong suck on gloved finger. baby's tongue bowl shaped. Upper lip tie noted  Mom reports she is doing well with bottle feeding. Mom fed baby here in office- baby did well. Great weight gain- up 8 oz since Tuesday. No questions at present. To call prn To see Ped next Tuesday  Mother's reason for visit: " To see if baby has tongue tie" Visit Type:  Feeding assessment Appointment Notes:  Was using NS in hospital because baby did not stay on the breast and mom had some pain with nursing Consult:  Initial Lactation Consultant:  Pamelia HoitWeeks, Carley Glendenning D  ________________________________________________________________________  Baby's Name: Megan [name] Date of Birth: 10/21/2017 Pediatrician: Tresa EndoKelly Gender: female Gestational Age: 5033w0d (At Birth) Birth Weight: 7 lb 13.6 oz (3560 g) Weight at Discharge: Weight: 7 lb 8 oz (3402 g)Date of Discharge: 10/23/2015 Filed Weights   10/21/15 0555 10/21/15 2346 10/22/15 2314  Weight: 7 lb 13.6 oz (3560 g) 7 lb 9.7 oz (3450 g) 7 lb 8 oz (3402 g)      Weight today: 8 lbs 3.1 oz  3718g ________________________________________________________________________  Mother's Name: Megan [name] Type of delivery:  C/S Breastfeeding Experience:  P1  ________________________________________________________________________  Breastfeeding History (Post Discharge)  Frequency of breastfeeding:  Mom has been pumping and bottle feeding EBM   Supplementation  Formula:  Volume 0 ml   Breastmilk:  Volume 3 oz Frequency:  q  2-3 hours Mom reports baby was fussy after feeding so she started giving her a little more.  Method:  Bottle,   Pumping  Type of pump:  First Years Frequency:  q 2-3 hours Volume:  3-4 oz  Infant Intake and Output Assessment  Voids:  4-5+ in 24 hrs.  Color:  Clear yellow Stools:  4-5+ in 24 hrs.  Color:  Yellow  ________________________________________________________________________  Maternal Breast Assessment  Breast:  Soft Nipple:  Erect _______________________________________________________________________ Feeding Assessment/Evaluation  Initial feeding assessment:  Infant's oral assessment:  Variance- baby's tongue bowl shaped. Upper lip tie noted

## 2015-11-02 ENCOUNTER — Ambulatory Visit: Payer: Medicaid Other | Admitting: Certified Nurse Midwife

## 2015-11-02 ENCOUNTER — Encounter (HOSPITAL_COMMUNITY): Payer: Self-pay | Admitting: *Deleted

## 2015-11-02 ENCOUNTER — Emergency Department (HOSPITAL_COMMUNITY)
Admission: EM | Admit: 2015-11-02 | Discharge: 2015-11-02 | Disposition: A | Discharge status: Home / Self Care | Payer: Medicaid Other | Attending: Emergency Medicine | Admitting: Emergency Medicine

## 2015-11-02 ENCOUNTER — Emergency Department (HOSPITAL_COMMUNITY): Payer: Medicaid Other

## 2015-11-02 DIAGNOSIS — Z8679 Personal history of other diseases of the circulatory system: Secondary | ICD-10-CM | POA: Diagnosis not present

## 2015-11-02 DIAGNOSIS — Z8719 Personal history of other diseases of the digestive system: Secondary | ICD-10-CM | POA: Diagnosis not present

## 2015-11-02 DIAGNOSIS — R110 Nausea: Secondary | ICD-10-CM | POA: Insufficient documentation

## 2015-11-02 DIAGNOSIS — O9089 Other complications of the puerperium, not elsewhere classified: Secondary | ICD-10-CM | POA: Insufficient documentation

## 2015-11-02 DIAGNOSIS — R51 Headache: Secondary | ICD-10-CM | POA: Insufficient documentation

## 2015-11-02 DIAGNOSIS — H538 Other visual disturbances: Secondary | ICD-10-CM | POA: Insufficient documentation

## 2015-11-02 DIAGNOSIS — R519 Headache, unspecified: Secondary | ICD-10-CM

## 2015-11-02 MED ORDER — SODIUM CHLORIDE 0.9 % IV SOLN
1000.0000 mL | Freq: Once | INTRAVENOUS | Status: AC
  Administered 2015-11-02: 1000 mL via INTRAVENOUS

## 2015-11-02 MED ORDER — SODIUM CHLORIDE 0.9 % IV SOLN
1000.0000 mL | INTRAVENOUS | Status: DC
  Administered 2015-11-02: 1000 mL via INTRAVENOUS

## 2015-11-02 MED ORDER — DIPHENHYDRAMINE HCL 50 MG/ML IJ SOLN
25.0000 mg | Freq: Once | INTRAMUSCULAR | Status: AC
Start: 2015-11-02 — End: 2015-11-02
  Administered 2015-11-02: 25 mg via INTRAVENOUS
  Filled 2015-11-02: qty 1

## 2015-11-02 MED ORDER — PROCHLORPERAZINE EDISYLATE 5 MG/ML IJ SOLN
10.0000 mg | Freq: Four times a day (QID) | INTRAMUSCULAR | Status: DC | PRN
  Administered 2015-11-02: 10 mg via INTRAVENOUS
  Filled 2015-11-02: qty 2

## 2015-11-02 NOTE — ED Notes (Signed)
Pt in c/o headache since c-section delivery on 12/28, states she had an epidural, pt reports she is unable to get relief from pain at home, denies n/v, denies complications with delivery

## 2015-11-02 NOTE — Discharge Instructions (Signed)

## 2015-11-02 NOTE — ED Provider Notes (Signed)
CSN: [id number]     Arrival date & time 11/02/15  0211 History  By signing my name below, I, Bethel Born, attest that this documentation has been prepared under the direction and in the presence of Dione Booze, MD. Electronically Signed: Bethel Born, ED Scribe. 11/02/2015. 3:21 AM   Chief Complaint  Patient presents with  . Headache   The history is provided by the patient. No language interpreter was used.   Megan [name] is a 20 y.o. female with PMHx of migraines who presents to the Emergency Department complaining of a worsening, 10/10 in severity, throbbing, diffuse headache (most pronounced at vertex) with onset 2 weeks ago. The pain is exacerbated by light and sound. Tylenol and 1 dose of oxycodone have provided insufficient pain relief at home. Associated symptoms include nausea. Pt denies vomiting. Pt gave birth by cesarean section on 10/20/25 without complication. She is breastfeeding.   Past Medical History  Diagnosis Date  . Migraines   . GERD (gastroesophageal reflux disease)   . Gallstones    Past Surgical History  Procedure Laterality Date  . Tumor removal      L foot  . Cesarean section N/A 10/21/2015    Procedure: CESAREAN SECTION;  Surgeon: Brock Bad, MD;  Location: [facility];  Service: Obstetrics;  Laterality: N/A;   Family History  Problem Relation Age of Onset  . Heart disease Father   . Hypertension Father   . Diabetes Father   . Stroke Father   . Depression Father    Social History  Substance Use Topics  . Smoking status: Never Smoker   . Smokeless tobacco: None  . Alcohol Use: No   OB History    Gravida Para Term Preterm AB TAB SAB Ectopic Multiple Living   1 1 1       0 1     Review of Systems  Eyes: Positive for photophobia.  Gastrointestinal: Positive for nausea. Negative for vomiting.  Neurological: Positive for headaches.  All other systems reviewed and are negative.   Allergies  Review of patient's allergies indicates no  known allergies.  Home Medications   Prior to Admission medications   Medication Sig Start Date End Date Taking? Authorizing Provider  oxyCODONE-acetaminophen (PERCOCET) 10-325 MG tablet Take 1 tablet by mouth every 4 (four) hours as needed for pain.    Historical Provider, MD   BP 122/61 mmHg  Pulse 84  Temp(Src) 98.5 F (36.9 C) (Oral)  Resp 20  SpO2 100%  LMP 01/18/2015 Physical Exam  Constitutional: She is oriented to person, place, and time. She appears well-developed and well-nourished. No distress.  HENT:  Head: Normocephalic and atraumatic.  Eyes: EOM are normal. Pupils are equal, round, and reactive to light.  Fundi are normal  Neck: Normal range of motion. Neck supple. No JVD present.  Cardiovascular: Normal rate, regular rhythm and normal heart sounds.   No murmur heard. Pulmonary/Chest: Effort normal and breath sounds normal. She has no wheezes. She has no rales. She exhibits no tenderness.  Abdominal: Soft. She exhibits no distension and no mass. There is no tenderness.  Bowel sounds are decreased.  Musculoskeletal: Normal range of motion. She exhibits no edema.  Lymphadenopathy:    She has no cervical adenopathy.  Neurological: She is alert and oriented to person, place, and time. No cranial nerve deficit. She exhibits normal muscle tone. Coordination normal.  Skin: Skin is warm and dry. No rash noted.  Psychiatric: She has a normal mood and affect.  Her behavior is normal. Judgment and thought content normal.  No motor or sensory deficits.  Nursing note and vitals reviewed.   ED Course  Procedures (including critical care time) DIAGNOSTIC STUDIES: Oxygen Saturation is 100% on RA,  normal by my interpretation.    COORDINATION OF CARE: 3:16 AM Discussed treatment plan which includes CT head without contrast, Compazine, Benadryl, and IVF with pt at bedside and pt agreed to plan.  Imaging Review Ct Head Wo Contrast  11/02/2015  CLINICAL DATA:  20 year old  female with headache EXAM: CT HEAD WITHOUT CONTRAST TECHNIQUE: Contiguous axial images were obtained from the base of the skull through the vertex without intravenous contrast. COMPARISON:  Head CT dated 09/13/2014 FINDINGS: The ventricles and the sulci are appropriate in size for the patient's age. There is no intracranial hemorrhage. No midline shift or mass effect identified. The gray-white matter differentiation is preserved. The visualized paranasal sinuses and mastoid air cells are well aerated. The calvarium is intact. IMPRESSION: No acute intracranial pathology. Electronically Signed   By: Elgie CollardArash  Radparvar M.D.   On: 11/02/2015 04:33     I have personally reviewed and evaluated these images as part of my medical decision-making.   MDM   Final diagnoses:  Headache, unspecified headache type   Headache which started shortly postpartum. Old records were reviewed showing cesarean section birth on December 28. She would be at risk for Sheehan Syndrome, so will be sent for CT of head to rule out intracranial hemorrhage. She is given a headache cocktail of IV saline, prochlorperazine, and diphenhydramine.  CT shows no evidence of intracranial hemorrhage. She had excellent relief of her headache with above noted treatment and is discharged.   I personally performed the services described in this documentation, which was scribed in my presence. The recorded information has been reviewed and is accurate.      Dione Boozeavid Yulianna Folse, MD 11/02/15 [phone]

## 2015-11-02 NOTE — ED Notes (Signed)
Pt left with all her belongings and ambulated out of the treatment area.  

## 2015-11-03 ENCOUNTER — Telehealth: Payer: Self-pay | Admitting: *Deleted

## 2015-11-03 NOTE — Telephone Encounter (Signed)
[facility] nurse is calling to let us know that Megan [name] scored a 12 on her PP depression screen. She delivered on 12/27. She reports she does feel depressed at times. She has been having chronic migraines and she had to go to the ED for treatment ( Vitals were normal). She is scheduled next Tuesday for her PP visit. Do you want her appointment moved up?

## 2015-11-09 ENCOUNTER — Ambulatory Visit (INDEPENDENT_AMBULATORY_CARE_PROVIDER_SITE_OTHER): Payer: Medicaid Other | Admitting: Obstetrics

## 2015-11-09 ENCOUNTER — Encounter: Payer: Self-pay | Admitting: Obstetrics

## 2015-11-09 DIAGNOSIS — G8918 Other acute postprocedural pain: Secondary | ICD-10-CM

## 2015-11-09 DIAGNOSIS — Z30011 Encounter for initial prescription of contraceptive pills: Secondary | ICD-10-CM

## 2015-11-09 DIAGNOSIS — D509 Iron deficiency anemia, unspecified: Secondary | ICD-10-CM

## 2015-11-09 DIAGNOSIS — K5901 Slow transit constipation: Secondary | ICD-10-CM

## 2015-11-09 MED ORDER — FUSION PLUS PO CAPS: 1.0000 | ORAL_CAPSULE | Freq: Every day | ORAL | Status: AC

## 2015-11-09 MED ORDER — OXYCODONE-ACETAMINOPHEN 10-325 MG PO TABS: 1.0000 | ORAL_TABLET | Freq: Four times a day (QID) | ORAL | Status: DC | PRN

## 2015-11-09 MED ORDER — IBUPROFEN 800 MG PO TABS: 800.0000 mg | ORAL_TABLET | Freq: Three times a day (TID) | ORAL | Status: AC | PRN

## 2015-11-09 MED ORDER — NORETHINDRONE 0.35 MG PO TABS: 1.0000 | ORAL_TABLET | Freq: Every day | ORAL | Status: DC

## 2015-11-09 MED ORDER — DOCUSATE SODIUM 100 MG PO CAPS: 100.0000 mg | ORAL_CAPSULE | Freq: Two times a day (BID) | ORAL | Status: DC

## 2015-11-09 NOTE — Progress Notes (Addendum)
Subjective:     Megan [name] is a 20 y.o. female who presents for a postpartum visit. She is 2 weeks postpartum following a low cervical transverse Cesarean section. I have fully reviewed the prenatal and intrapartum course. The delivery was at 41 gestational weeks. Outcome: primary cesarean section, low transverse incision. Anesthesia: epidural. Postpartum course has been normal. Baby's course has been normal. Baby is feeding by breast-pumping. Bleeding thin lochia. Bowel function is normal. Bladder function is normal. Patient is not sexually active. Contraception method is abstinence. Postpartum depression screening: negative.  Tobacco, alcohol and substance abuse history reviewed.  Adult immunizations reviewed including TDAP, rubella and varicella.  The following portions of the patient's history were reviewed and updated as appropriate: allergies, current medications, past family history, past medical history, past social history, past surgical history and problem list.  Review of Systems A comprehensive review of systems was negative.   Objective:    BP 115/75 mmHg  Pulse 80  Wt 178 lb (80.74 kg)    PE:        General:  Alert and no distress       Abdomen:  Soft, nontender.  Incision clean, dry and intact.       Extremities: No C, C, E.  Assessment:    2 weeks postpartum.  Doing well.  Constipation  Contraception  Anemia, chronic iron deficiency  Plan:    1. Contraception: oral progesterone-only contraceptive   2. Colace Rx along with increased fluids, fiber and MOM prn  3. Micronor Rx  4. Fusion Plus Rx  5. Follow up in: 4 weeks or as needed.   Healthy lifestyle practices reviewed

## 2015-11-30 ENCOUNTER — Ambulatory Visit: Payer: Medicaid Other | Admitting: Certified Nurse Midwife

## 2015-12-07 ENCOUNTER — Encounter: Payer: Self-pay | Admitting: Obstetrics

## 2015-12-07 ENCOUNTER — Ambulatory Visit (INDEPENDENT_AMBULATORY_CARE_PROVIDER_SITE_OTHER): Payer: Medicaid Other | Admitting: Obstetrics

## 2015-12-07 DIAGNOSIS — D509 Iron deficiency anemia, unspecified: Secondary | ICD-10-CM

## 2015-12-07 DIAGNOSIS — F53 Postpartum depression: Secondary | ICD-10-CM

## 2015-12-07 DIAGNOSIS — O99345 Other mental disorders complicating the puerperium: Secondary | ICD-10-CM

## 2015-12-07 LAB — CBC
HCT: 37.4 % (ref 36.0–46.0)
HEMOGLOBIN: 11.9 g/dL — AB (ref 12.0–15.0)
MCH: 27.5 pg (ref 26.0–34.0)
MCHC: 31.8 g/dL (ref 30.0–36.0)
MCV: 86.4 fL (ref 78.0–100.0)
MPV: 9.2 fL (ref 8.6–12.4)
Platelets: 393 10*3/uL (ref 150–400)
RBC: 4.33 MIL/uL (ref 3.87–5.11)
RDW: 15 % (ref 11.5–15.5)
WBC: 9.5 10*3/uL (ref 4.0–10.5)

## 2015-12-07 MED ORDER — SERTRALINE HCL 25 MG PO TABS: 25.0000 mg | ORAL_TABLET | Freq: Every day | ORAL | Status: AC

## 2015-12-07 NOTE — Progress Notes (Signed)
Subjective:     Megan [name] is a 20 y.o. female who presents for a postpartum visit. She is 6 weeks postpartum following a low cervical transverse Cesarean section. I have fully reviewed the prenatal and intrapartum course. The delivery was at 41 gestational weeks. Outcome: primary cesarean section, low transverse incision. Anesthesia: epidural. Postpartum course has been normal. Baby's course has been normal. Baby is feeding by breast/bottle. Bleeding no bleeding. Bowel function is normal. Bladder function is normal. Patient is not sexually active. Contraception method is oral progesterone-only contraceptive. Postpartum depression screening: negative.  Tobacco, alcohol and substance abuse history reviewed.  Adult immunizations reviewed including TDAP, rubella and varicella.  The following portions of the patient's history were reviewed and updated as appropriate: allergies, current medications, past family history, past medical history, past social history, past surgical history and problem list.  Review of Systems Positive for postpartum depression  Objective:    BP 120/75 mmHg  Pulse 78  Wt 179 lb (81.194 kg)  Breastfeeding? Yes  General:  alert and no distress   Breasts:  inspection negative, no nipple discharge or bleeding, no masses or nodularity palpable  Lungs: clear to auscultation bilaterally  Heart:  regular rate and rhythm, S1, S2 normal, no murmur, click, rub or gallop  Abdomen: soft, non-tender; bowel sounds normal; no masses,  no organomegaly   Vulva:  normal  Vagina: normal vagina  Cervix:  no cervical motion tenderness  Corpus: normal size, contour, position, consistency, mobility, non-tender  Adnexa:  no mass, fullness, tenderness  Rectal Exam: Not performed.           Assessment:     Normal postpartum exam. Pap smear not done at today's visit.     Postpartum depression   H/O anemia postpartum.  On iron / pnv's.    Plan:    1. Contraception: oral  progesterone-only contraceptive 2. Zoloft Rx 3. Check CBC 4. Follow up in: 6 weeks or as needed.    Healthy lifestyle practices reviewed

## 2015-12-09 ENCOUNTER — Telehealth: Payer: Self-pay | Admitting: *Deleted

## 2015-12-09 NOTE — Telephone Encounter (Signed)
Pt called to office requesting medication for incision pain, stating ibuprofen is not working. Pt was seen in office this week for PP visit, no mention of pain.  Pt had c/s in 10-21-15.  Please advise.

## 2015-12-10 NOTE — Telephone Encounter (Signed)
Continue Ibuprofen 

## 2015-12-10 NOTE — Telephone Encounter (Signed)
Pt made aware of reccommedations.

## 2015-12-27 ENCOUNTER — Encounter (HOSPITAL_COMMUNITY): Payer: Self-pay | Admitting: Emergency Medicine

## 2015-12-27 ENCOUNTER — Emergency Department (HOSPITAL_COMMUNITY)
Admission: EM | Admit: 2015-12-27 | Discharge: 2015-12-27 | Disposition: A | Discharge status: Home / Self Care | Payer: Medicaid Other | Attending: Emergency Medicine | Admitting: Emergency Medicine

## 2015-12-27 DIAGNOSIS — G43909 Migraine, unspecified, not intractable, without status migrainosus: Secondary | ICD-10-CM | POA: Diagnosis present

## 2015-12-27 MED ORDER — ONDANSETRON 4 MG PO TBDP
ORAL_TABLET | ORAL | Status: AC
  Filled 2015-12-27: qty 1

## 2015-12-27 MED ORDER — ONDANSETRON 4 MG PO TBDP
4.0000 mg | ORAL_TABLET | Freq: Once | ORAL | Status: AC | PRN
  Administered 2015-12-27: 4 mg via ORAL

## 2015-12-27 NOTE — ED Notes (Signed)
Patient here with complaint of migraine headache. States onset yesterday. Endorses history of migraines with daily headaches, but they're not usually this bad. Also endorses nausea and hot flashes. Denies fever and vomiting.

## 2016-01-06 ENCOUNTER — Encounter (HOSPITAL_COMMUNITY): Payer: Self-pay

## 2016-01-06 DIAGNOSIS — M791 Myalgia: Secondary | ICD-10-CM | POA: Diagnosis not present

## 2016-01-06 DIAGNOSIS — R531 Weakness: Secondary | ICD-10-CM | POA: Diagnosis not present

## 2016-01-06 DIAGNOSIS — Z79899 Other long term (current) drug therapy: Secondary | ICD-10-CM | POA: Diagnosis not present

## 2016-01-06 DIAGNOSIS — R6883 Chills (without fever): Secondary | ICD-10-CM | POA: Insufficient documentation

## 2016-01-06 DIAGNOSIS — R110 Nausea: Secondary | ICD-10-CM | POA: Insufficient documentation

## 2016-01-06 DIAGNOSIS — R000 Tachycardia, unspecified: Secondary | ICD-10-CM | POA: Insufficient documentation

## 2016-01-06 DIAGNOSIS — H53149 Visual discomfort, unspecified: Secondary | ICD-10-CM | POA: Insufficient documentation

## 2016-01-06 DIAGNOSIS — Z8679 Personal history of other diseases of the circulatory system: Secondary | ICD-10-CM | POA: Insufficient documentation

## 2016-01-06 DIAGNOSIS — R51 Headache: Secondary | ICD-10-CM | POA: Diagnosis not present

## 2016-01-06 DIAGNOSIS — Z8719 Personal history of other diseases of the digestive system: Secondary | ICD-10-CM | POA: Insufficient documentation

## 2016-01-06 DIAGNOSIS — R197 Diarrhea, unspecified: Secondary | ICD-10-CM | POA: Diagnosis not present

## 2016-01-06 DIAGNOSIS — Z793 Long term (current) use of hormonal contraceptives: Secondary | ICD-10-CM | POA: Diagnosis not present

## 2016-01-06 LAB — URINALYSIS, ROUTINE W REFLEX MICROSCOPIC
Bilirubin Urine: NEGATIVE
Glucose, UA: NEGATIVE mg/dL
HGB URINE DIPSTICK: NEGATIVE
KETONES UR: NEGATIVE mg/dL
LEUKOCYTES UA: NEGATIVE
Nitrite: NEGATIVE
PROTEIN: NEGATIVE mg/dL
Specific Gravity, Urine: 1.024 (ref 1.005–1.030)
pH: 6 (ref 5.0–8.0)

## 2016-01-06 LAB — COMPREHENSIVE METABOLIC PANEL
ALK PHOS: 103 U/L (ref 38–126)
ALT: 33 U/L (ref 14–54)
AST: 29 U/L (ref 15–41)
Albumin: 3.8 g/dL (ref 3.5–5.0)
Anion gap: 12 (ref 5–15)
BILIRUBIN TOTAL: 0.3 mg/dL (ref 0.3–1.2)
BUN: 14 mg/dL (ref 6–20)
CALCIUM: 9.4 mg/dL (ref 8.9–10.3)
CO2: 22 mmol/L (ref 22–32)
CREATININE: 0.78 mg/dL (ref 0.44–1.00)
Chloride: 106 mmol/L (ref 101–111)
Glucose, Bld: 93 mg/dL (ref 65–99)
Potassium: 4.4 mmol/L (ref 3.5–5.1)
Sodium: 140 mmol/L (ref 135–145)
Total Protein: 6.8 g/dL (ref 6.5–8.1)

## 2016-01-06 LAB — CBC
HCT: 40.7 % (ref 36.0–46.0)
Hemoglobin: 12.7 g/dL (ref 12.0–15.0)
MCH: 27.7 pg (ref 26.0–34.0)
MCHC: 31.2 g/dL (ref 30.0–36.0)
MCV: 88.9 fL (ref 78.0–100.0)
PLATELETS: 302 10*3/uL (ref 150–400)
RBC: 4.58 MIL/uL (ref 3.87–5.11)
RDW: 14 % (ref 11.5–15.5)
WBC: 9.3 10*3/uL (ref 4.0–10.5)

## 2016-01-06 LAB — I-STAT BETA HCG BLOOD, ED (MC, WL, AP ONLY)

## 2016-01-06 LAB — LIPASE, BLOOD: Lipase: 26 U/L (ref 11–51)

## 2016-01-06 NOTE — ED Notes (Signed)
Pt here with c/o headache, dizziness, generalized body aches, cold chills, nausea and diarrhea, denies vomiting; symptom onset this morning around 0400.

## 2016-01-07 ENCOUNTER — Emergency Department (HOSPITAL_COMMUNITY)
Admission: EM | Admit: 2016-01-07 | Discharge: 2016-01-07 | Disposition: A | Discharge status: Home / Self Care | Payer: Medicaid Other | Attending: Emergency Medicine | Admitting: Emergency Medicine

## 2016-01-07 DIAGNOSIS — R519 Headache, unspecified: Secondary | ICD-10-CM

## 2016-01-07 DIAGNOSIS — R51 Headache: Secondary | ICD-10-CM

## 2016-01-07 DIAGNOSIS — R110 Nausea: Secondary | ICD-10-CM

## 2016-01-07 MED ORDER — PROCHLORPERAZINE EDISYLATE 5 MG/ML IJ SOLN
10.0000 mg | Freq: Four times a day (QID) | INTRAMUSCULAR | Status: DC | PRN
  Administered 2016-01-07: 10 mg via INTRAVENOUS
  Filled 2016-01-07: qty 2

## 2016-01-07 MED ORDER — SODIUM CHLORIDE 0.9 % IV BOLUS (SEPSIS)
1000.0000 mL | Freq: Once | INTRAVENOUS | Status: AC
  Administered 2016-01-07: 1000 mL via INTRAVENOUS

## 2016-01-07 MED ORDER — SODIUM CHLORIDE 0.9 % IV SOLN
Freq: Once | INTRAVENOUS | Status: AC
  Administered 2016-01-07: 01:00:00 via INTRAVENOUS

## 2016-01-07 MED ORDER — KETOROLAC TROMETHAMINE 30 MG/ML IJ SOLN
15.0000 mg | Freq: Once | INTRAMUSCULAR | Status: AC
  Administered 2016-01-07: 15 mg via INTRAVENOUS
  Filled 2016-01-07: qty 1

## 2016-01-07 MED ORDER — DIPHENHYDRAMINE HCL 50 MG/ML IJ SOLN
25.0000 mg | Freq: Once | INTRAMUSCULAR | Status: AC
  Administered 2016-01-07: 25 mg via INTRAVENOUS
  Filled 2016-01-07: qty 1

## 2016-01-07 NOTE — ED Notes (Signed)
Discharge instructions reviewed - voiced understanding 

## 2016-01-07 NOTE — ED Provider Notes (Signed)
CSN: [id number]     Arrival date & time 01/06/16  1906 History  By signing my name below, I, Freida Busman, attest that this documentation has been prepared under the direction and in the presence of Alvira Monday, MD . Electronically Signed: Freida Busman, Scribe. 01/07/2016. 2:17 AM.    Chief Complaint  Patient presents with  . Headache  . Generalized Body Aches    Patient is a 20 y.o. female presenting with headaches. The history is provided by the patient. No language interpreter was used.  Headache Pain location:  Occipital Quality:  Sharp Radiates to:  Does not radiate Onset quality:  Gradual Timing:  Constant Progression:  Worsening Chronicity:  Recurrent Similar to prior headaches: yes   Ineffective treatments:  NSAIDs Associated symptoms: diarrhea, myalgias, nausea, photophobia and weakness (generalized)   Associated symptoms: no abdominal pain, no back pain, no cough, no ear pain, no fever, no neck pain, no sore throat and no vomiting     HPI Comments:  Megan [name] is a 20 y.o. female with a history of migraines,  who presents to the Emergency Department complaining of HA today with sharp pain to occipital region and radiates upward. Her pain is exacerbated with light. She has taken ibuprofen without relief. Pt states her pain is similar to past migraines. She reports associated generalized body aches, nausea,  generalized weakness, chills and 2 episodes of diarrhea yesterday; none today. She denies unilateral weakness, fever, cough, and dysuria. Pt also denies recent sick contacts.   Past Medical History  Diagnosis Date  . Migraines   . GERD (gastroesophageal reflux disease)   . Gallstones    Past Surgical History  Procedure Laterality Date  . Tumor removal      L foot  . Cesarean section N/A 10/21/2015    Procedure: CESAREAN SECTION;  Surgeon: Brock Bad, MD;  Location: [facility];  Service: Obstetrics;  Laterality: N/A;   Family History  Problem Relation  Age of Onset  . Heart disease Father   . Hypertension Father   . Diabetes Father   . Stroke Father   . Depression Father    Social History  Substance Use Topics  . Smoking status: Never Smoker   . Smokeless tobacco: None  . Alcohol Use: No   OB History    Gravida Para Term Preterm AB TAB SAB Ectopic Multiple Living   0 1     Review of Systems  Constitutional: Positive for chills. Negative for fever.  HENT: Negative for ear pain and sore throat.   Eyes: Positive for photophobia. Negative for visual disturbance.  Respiratory: Negative for cough and shortness of breath.   Cardiovascular: Negative for chest pain.  Gastrointestinal: Positive for nausea and diarrhea. Negative for vomiting, abdominal pain and constipation.  Genitourinary: Negative for dysuria and difficulty urinating.  Musculoskeletal: Positive for myalgias. Negative for back pain and neck pain.  Skin: Negative for rash.  Neurological: Positive for weakness (generalized) and headaches. Negative for syncope.    Allergies  Review of patient's allergies indicates no known allergies.  Home Medications   Prior to Admission medications   Medication Sig Start Date End Date Taking? Authorizing Provider  acetaminophen (TYLENOL) 500 MG tablet Take 1,000 mg by mouth every 6 (six) hours as needed for mild pain. Reported on 12/07/2015   Yes Historical Provider, MD  ibuprofen (ADVIL,MOTRIN) 800 MG tablet Take 1 tablet (800 mg total) by mouth every 8 (eight)  hours as needed. Patient taking differently: Take 800 mg by mouth every 8 (eight) hours as needed for moderate pain.  11/09/15  Yes Brock Badharles A Harper, MD  Iron-FA-B Cmp-C-Biot-Probiotic (FUSION PLUS) CAPS Take 1 capsule by mouth daily before breakfast. 11/09/15  Yes Brock Badharles A Harper, MD  norethindrone (MICRONOR,CAMILA,ERRIN) 0.35 MG tablet Take 1 tablet (0.35 mg total) by mouth daily. 11/09/15  Yes Brock Badharles A Harper, MD  sertraline (ZOLOFT) 25 MG tablet Take 1 tablet  (25 mg total) by mouth daily. 12/07/15  Yes Brock Badharles A Harper, MD   BP 125/79 mmHg  Pulse 105  Temp(Src) 99.3 F (37.4 C) (Oral)  Resp 18  SpO2 98%  LMP 11/28/2015 (Exact Date) Physical Exam  Constitutional: She is oriented to person, place, and time. She appears well-developed and well-nourished. No distress.  HENT:  Head: Normocephalic and atraumatic.  Eyes: Conjunctivae and EOM are normal.  Neck: Normal range of motion. Neck supple.  FROM  Negative meningeal signs   Cardiovascular: Normal rate, regular rhythm, normal heart sounds and intact distal pulses.  Exam reveals no gallop and no friction rub.   No murmur heard. Pulmonary/Chest: Effort normal and breath sounds normal. No respiratory distress. She has no wheezes. She has no rales.  Abdominal: Soft. She exhibits no distension. There is no tenderness. There is no guarding.  Musculoskeletal: She exhibits no edema or tenderness.  Neurological: She is alert and oriented to person, place, and time. She has normal strength. No cranial nerve deficit or sensory deficit. Coordination normal. GCS eye subscore is 4. GCS verbal subscore is 5. GCS motor subscore is 6.  Skin: Skin is warm and dry. No rash noted. She is not diaphoretic. No erythema.  Nursing note and vitals reviewed.   ED Course  Procedures   DIAGNOSTIC STUDIES:  Oxygen Saturation is 95% on RA, normal by my interpretation.    COORDINATION OF CARE:  1:48 AM Discussed treatment plan with pt at bedside and pt agreed to plan.  Labs Review Labs Reviewed  LIPASE, BLOOD  COMPREHENSIVE METABOLIC PANEL  CBC  URINALYSIS, ROUTINE W REFLEX MICROSCOPIC (NOT [facility])  I-STAT BETA HCG BLOOD, ED ([facility], [facility], [facility] ONLY)    Imaging Review No results found. I have personally reviewed and evaluated these images and lab results as part of my medical decision-making.   EKG Interpretation None      MDM   Pain mildly improved after compazine. Will order toradol.  HA treated and  improved while in ED.  Final diagnoses:  Acute nonintractable headache, unspecified headache type  Nausea   19yo female with history of migraines presents with concern of headache.  Headache began slowly, no trauma, no fevers, and normal neurologic exam and have low suspicion for [facility], SDH or meningitis. FROM of neck without meningismus.   She also describes body aches, diarrhea, nausea.  Pt tachycardic in ED. UPreg negative, no sign of UTI. Lipase WNL. No leukocytosis. Pt likely with viral illness, dehydration exacerbating migraine. Patient was given compazine, benadryl, toradol and 2L of IVF with improvement in headache and tachycardia.  Patient discharged in stable condition with understanding of reasons to return.    I personally performed the services described in this documentation, which was scribed in my presence. The recorded information has been reviewed and is accurate.    Alvira MondayErin Margarine Grosshans, MD 01/07/16 1429

## 2016-01-07 NOTE — ED Notes (Signed)
Patient presents stating she has had a migraine for 2 days (history of the same), body aches (esp. In her back) and just not feeling right.  Admitted to diarrhea 2 days ago and nausea that comes and goes. Heart rate up,

## 2016-01-17 ENCOUNTER — Ambulatory Visit: Payer: Medicaid Other | Admitting: Obstetrics

## 2016-05-29 ENCOUNTER — Telehealth: Payer: Self-pay | Admitting: Obstetrics

## 2016-05-29 NOTE — Telephone Encounter (Signed)
Pt stated that Dr.Harper told her to call when she stopped breast feeding because her current birth control pills are for breast feeding mothers and once she stopped breast feeding he wanted to switch her to another pill. Pt uses the [facility] on [address] in [place], [place]. Please advise

## 2016-05-31 NOTE — Telephone Encounter (Signed)
Please Rx for patient

## 2016-06-01 ENCOUNTER — Other Ambulatory Visit: Payer: Self-pay | Admitting: Obstetrics

## 2016-06-01 DIAGNOSIS — Z30011 Encounter for initial prescription of contraceptive pills: Secondary | ICD-10-CM

## 2016-06-01 MED ORDER — NORETHINDRONE-ETH ESTRADIOL 1-35 MG-MCG PO TABS: 1.0000 | ORAL_TABLET | Freq: Every day | ORAL | 11 refills | Status: AC

## 2016-06-16 ENCOUNTER — Emergency Department (HOSPITAL_COMMUNITY)
Admission: EM | Admit: 2016-06-16 | Discharge: 2016-06-16 | Disposition: A | Discharge status: Home / Self Care | Payer: Medicaid Other | Attending: Emergency Medicine | Admitting: Emergency Medicine

## 2016-06-16 ENCOUNTER — Encounter (HOSPITAL_COMMUNITY): Payer: Self-pay | Admitting: Emergency Medicine

## 2016-06-16 DIAGNOSIS — W57XXXA Bitten or stung by nonvenomous insect and other nonvenomous arthropods, initial encounter: Secondary | ICD-10-CM | POA: Insufficient documentation

## 2016-06-16 DIAGNOSIS — Y939 Activity, unspecified: Secondary | ICD-10-CM | POA: Diagnosis not present

## 2016-06-16 DIAGNOSIS — Y999 Unspecified external cause status: Secondary | ICD-10-CM | POA: Insufficient documentation

## 2016-06-16 DIAGNOSIS — S60562A Insect bite (nonvenomous) of left hand, initial encounter: Secondary | ICD-10-CM | POA: Insufficient documentation

## 2016-06-16 DIAGNOSIS — Y929 Unspecified place or not applicable: Secondary | ICD-10-CM | POA: Diagnosis not present

## 2016-06-16 NOTE — ED Triage Notes (Signed)
Pt states she was bit by an insect on L palm approx 20 min ago.  C/o burning pain/numbness that is radiating up forearm.  Denies sob.  Denies itching.

## 2016-06-16 NOTE — Discharge Instructions (Signed)
Read the information below.   For itching you can take benadryl or zyrtec. Benadryl may make you drowsy, do not drive after taking. You can apply OTC topical hydrocortisone cream or diphenhydramine cream to the area for symptomatic relief. You can also try applying ice pack for relief.  I encourage you to follow up with your primary care provider if your symptoms persist. If you do not have a primary doctor, I have provided the contact information for [facility].  You may return to the Emergency Department at any time for worsening condition or any new symptoms that concern you. Return to ED if you develop fever, redness/swelling/warmth, discharge from area, numbness/weakness, difficulty swallowing, difficulty breathing, or full body rash.

## 2016-06-16 NOTE — ED Notes (Signed)
Pt stable, ambulatory, states understanding of discharge instructions 

## 2016-06-16 NOTE — ED Provider Notes (Signed)
[facility]-EMERGENCY DEPT Provider Note   CSN: [id number] Arrival date & time: 06/16/16  1947  By signing my name below, I, Linna Darner, attest that this documentation has been prepared under the direction and in the presence of Arvilla Meres, PA-C. Electronically Signed: Linna Darner, Scribe. 06/16/2016. 9:27 PM.  History   Chief Complaint Chief Complaint  Patient presents with  . Insect Bite    The history is provided by the patient. No language interpreter was used.     HPI Comments: Megan [name] is a 20 y.o. female who presents to the Emergency Department complaining of insect bite to her left palm occurring shortly PTA. She states she initially experienced burning pain, itching, red and white color change, and swelling of her left palm. She reports pain radiation into her left arm initially as well as some left arm numbness that have resolved. Pt states her left palm only feels somewhat sore currently and her other symptoms have resolved. Pt does not know what bit her but believes it may have been a mosquito; she denies h/o allergic reaction to insects. She tried a topical cream at home PTA with some relief of her symptoms. Pt denies trouble swallowing, oral lesions, SOB, fever, or any other associated symptoms.  Past Medical History:  Diagnosis Date  . Gallstones   . GERD (gastroesophageal reflux disease)   . Migraines     Patient Active Problem List   Diagnosis Date Noted  . S/P cesarean section 10/21/2015  . Indication for care in labor or delivery 10/20/2015  . Abdominal pain, left lower quadrant 03/08/2013  . Migraine with status migrainosus 03/07/2013    Past Surgical History:  Procedure Laterality Date  . CESAREAN SECTION N/A 10/21/2015   Procedure: CESAREAN SECTION;  Surgeon: Brock Bad, MD;  Location: [facility];  Service: Obstetrics;  Laterality: N/A;  . TUMOR REMOVAL     L foot    OB History    Gravida Para Term Preterm AB Living   1 1 1     1    SAB  TAB Ectopic Multiple Live Births         0 1       Home Medications    Prior to Admission medications   Medication Sig Start Date End Date Taking? Authorizing Provider  acetaminophen (TYLENOL) 500 MG tablet Take 1,000 mg by mouth every 6 (six) hours as needed for mild pain. Reported on 12/07/2015    Historical Provider, MD  ibuprofen (ADVIL,MOTRIN) 800 MG tablet Take 1 tablet (800 mg total) by mouth every 8 (eight) hours as needed. Patient taking differently: Take 800 mg by mouth every 8 (eight) hours as needed for moderate pain.  11/09/15   Brock Bad, MD  Iron-FA-B Cmp-C-Biot-Probiotic (FUSION PLUS) CAPS Take 1 capsule by mouth daily before breakfast. 11/09/15   Brock Bad, MD  norethindrone-ethinyl estradiol 1/35 (ORTHO-NOVUM 1/35, 28,) tablet Take 1 tablet by mouth daily. 06/01/16   Brock Bad, MD  sertraline (ZOLOFT) 25 MG tablet Take 1 tablet (25 mg total) by mouth daily. 12/07/15   Brock Bad, MD    Family History Family History  Problem Relation Age of Onset  . Heart disease Father   . Hypertension Father   . Diabetes Father   . Stroke Father   . Depression Father     Social History Social History  Substance Use Topics  . Smoking status: Never Smoker  . Smokeless tobacco: Never Used  . Alcohol use  No     Allergies   Review of patient's allergies indicates no known allergies.   Review of Systems Review of Systems  Constitutional: Negative for fever.  HENT: Negative for trouble swallowing.        No oral lesions  Respiratory: Negative for shortness of breath.   Musculoskeletal: Positive for joint swelling (left hand, resolved).  Skin: Positive for color change (redness; resolved) and wound (insect bite left palm). Negative for rash.  Neurological: Positive for numbness (resolved). Negative for weakness.       Negative for sensation loss.    Physical Exam Updated Vital Signs BP 129/71 (BP Location: Right Arm)   Pulse 71   Temp 98.3 F  (36.8 C) (Oral)   Resp 18   Ht 5\' 1"  (1.549 m)   Wt 87.6 kg   LMP 05/26/2016 (Exact Date)   SpO2 100%   BMI 36.48 kg/m   Physical Exam  Constitutional: She is oriented to person, place, and time. She appears well-developed and well-nourished. No distress.  HENT:  Head: Normocephalic and atraumatic.  Mouth/Throat: Uvula is midline, oropharynx is clear and moist and mucous membranes are normal. No oral lesions. No trismus in the jaw. No uvula swelling. No oropharyngeal exudate.  No trismus. No uvula swelling. Uvula midline. Oral cavity clear.  Eyes: Conjunctivae and EOM are normal. Right eye exhibits no discharge. Left eye exhibits no discharge.  Neck: Normal range of motion and phonation normal. Neck supple. No neck rigidity. No tracheal deviation and normal range of motion present.  Cardiovascular: Normal rate.   Pulmonary/Chest: Effort normal. No stridor. No respiratory distress.  Musculoskeletal: Normal range of motion.  ROM of left hand intact. Strength and sensation intact. 2+ radial pulses b/l. Capillary refill <3seconds.   Neurological: She is alert and oriented to person, place, and time.  Left hand: sensation and strength intact.  Skin: Skin is warm and dry.  Left hand: small area of induration tenderness at the base of the 4th phalanx on palmar surfae. No surrounding redness, erythema, warmth, drainage, or puncture marks. Soft compartments.   Psychiatric: She has a normal mood and affect. Her behavior is normal.  Nursing note and vitals reviewed.   ED Treatments / Results  Labs (all labs ordered are listed, but only abnormal results are displayed) Labs Reviewed - No data to display  EKG  EKG Interpretation None       Radiology No results found.  Procedures Procedures (including critical care time)  DIAGNOSTIC STUDIES: Oxygen Saturation is 100% on RA, normal by my interpretation.    COORDINATION OF CARE: 9:27 PM Discussed treatment plan with pt at bedside  and pt agreed to plan.  Medications Ordered in ED Medications - No data to display   Initial Impression / Assessment and Plan / ED Course  I have reviewed the triage vital signs and the nursing notes.  Pertinent labs & imaging results that were available during my care of the patient were reviewed by me and considered in my medical decision making (see chart for details).  Clinical Course    Patient presents to ED with complaint of insect bite. Patient is afebrile and non-toxic appearing in NAD. Vital signs are stable. Physical exam remarkable for small indurated area on left palm at base of 4th phalanx. No erythema, warmth, swelling, or discharge noted. ROM, strength, sensation intact. Capillary refill <3seconds. Compartments soft. No oral lesions or swelling. No trismus. No stridor. Respirations are unlabored. Patient able to talk in full  sentences. Suspect possible local reaction to insect bite. Discussed symptomatic management to include PO benadryl or zyrtec, topical hydrocortisone or benadryl, and ice. Encouraged follow up with PCP. Return precautions discussed. Patient voiced understanding and is agreeable.   I personally performed the services described in this documentation, which was scribed in my presence. The recorded information has been reviewed and is accurate.   Final Clinical Impressions(s) / ED Diagnoses   Final diagnoses:  Insect bite    New Prescriptions Discharge Medication List as of 06/16/2016  9:42 PM       Lona KettleAshley Laurel Arienne Gartin, PA-C 06/17/16 0236    Linwood DibblesJon Knapp, MD 06/17/16 2237

## 2016-08-03 IMAGING — US US OB COMP LESS 14 WK
1 series · 14 of 28 positions shown · non-contrast
Comparison: None.

CLINICAL DATA: Unsure of LMP. First trimester pregnancy with
inconclusive fetal viability.

EXAM:
OBSTETRIC <14 WK ULTRASOUND
TECHNIQUE: Transabdominal ultrasound was performed for evaluation of the
gestation as well as the maternal uterus and adnexal regions.

[Series 1: us ob transvaginal · 14 of 32 slices shown]
[im 2/32]
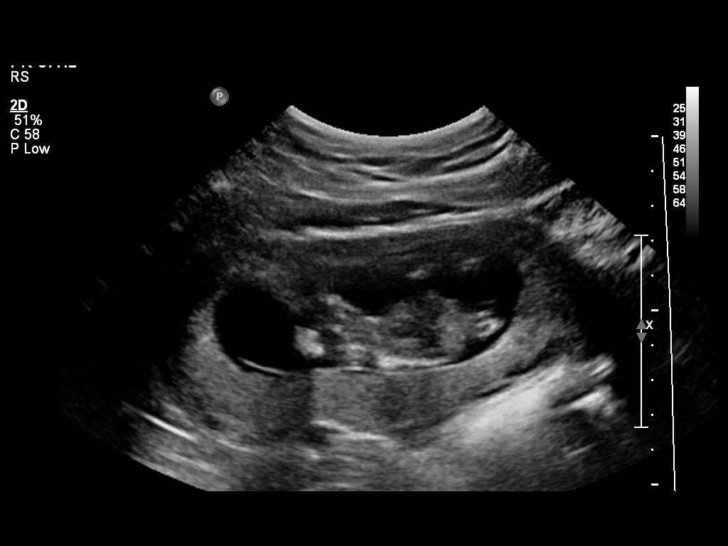
[im 4/32]
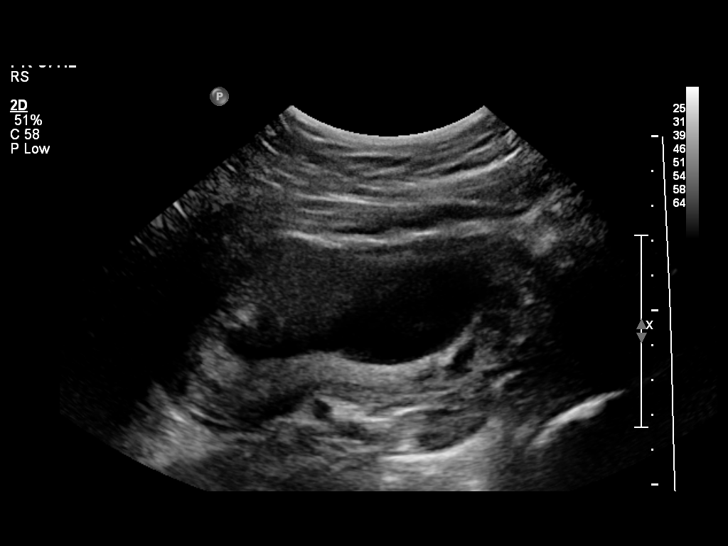
[im 6/32]
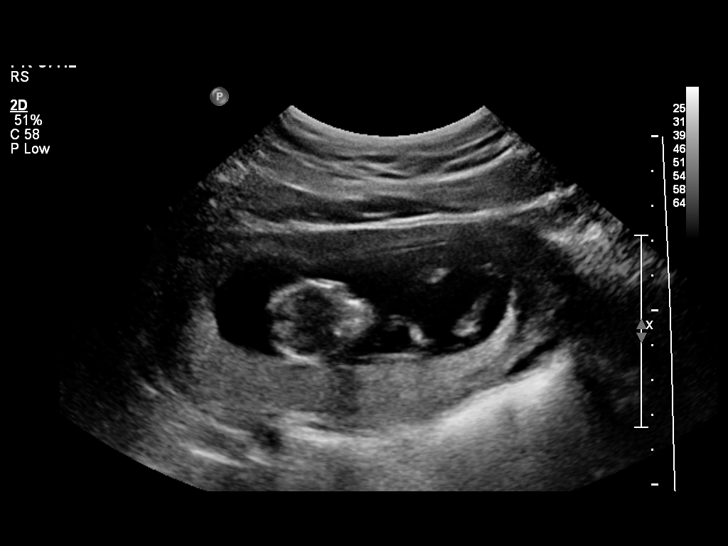
[im 9/32]
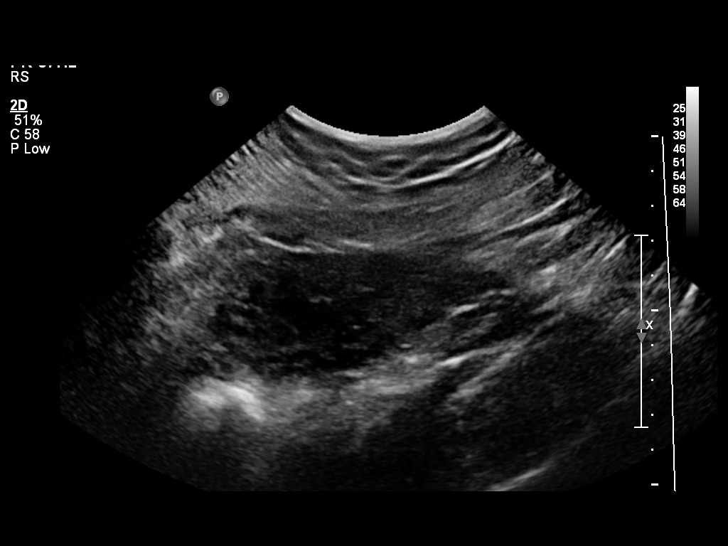
[im 11/32]
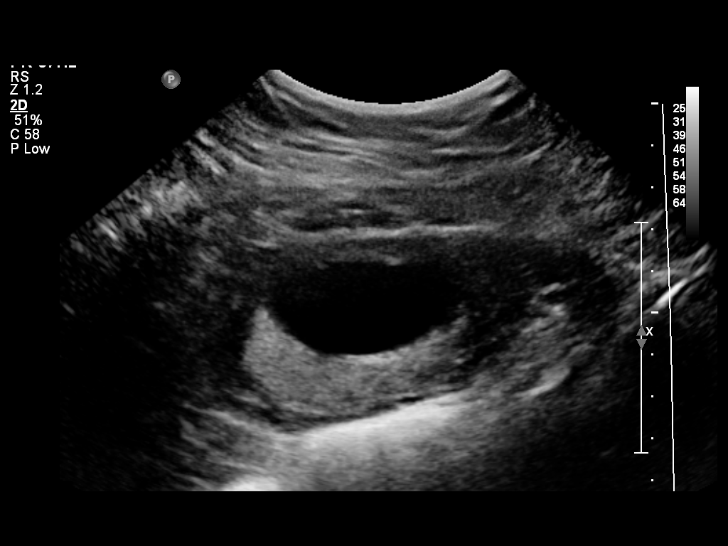
[im 13/32]
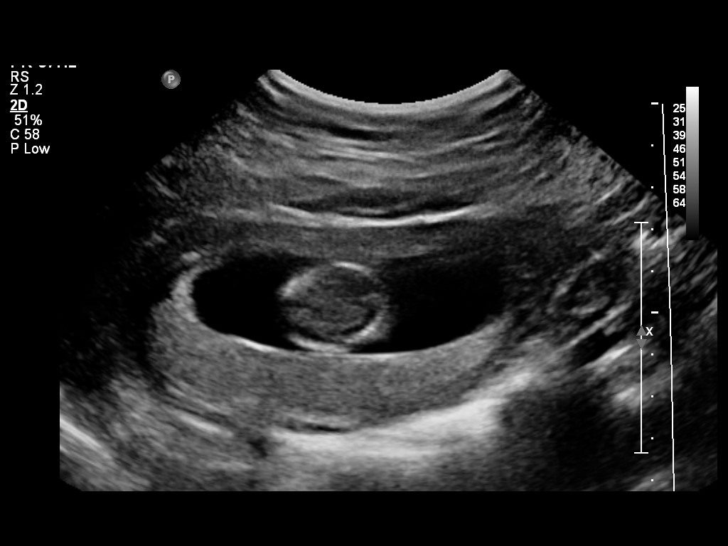
[im 15/32]
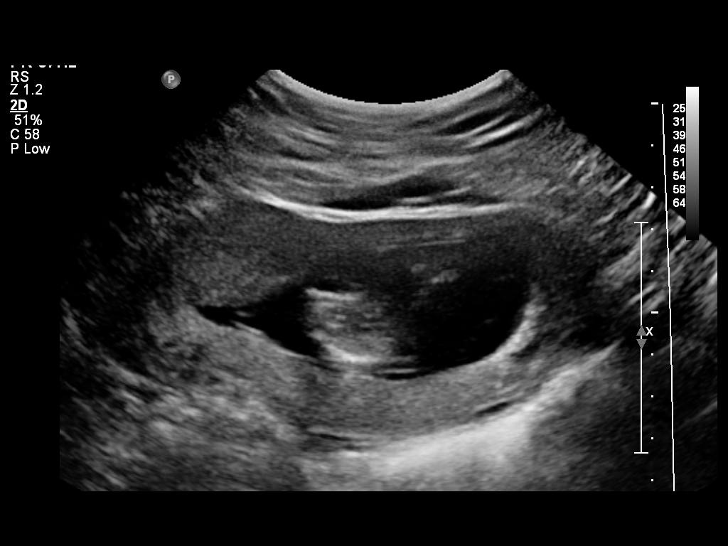
[im 18/32]
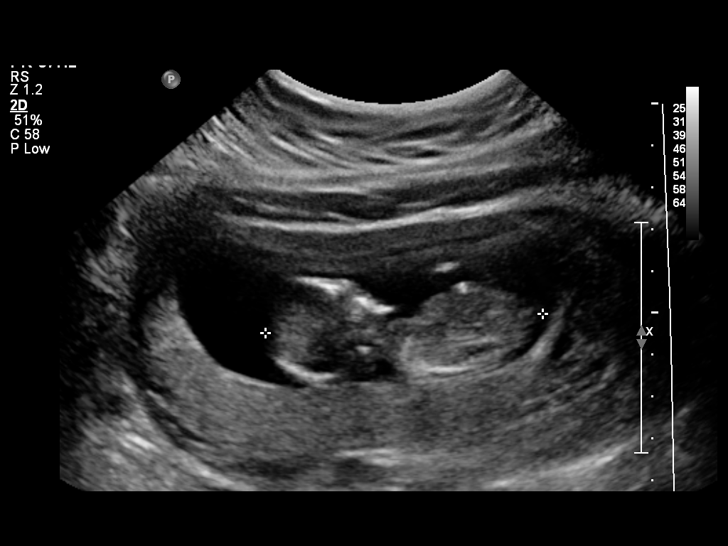
[im 20/32]
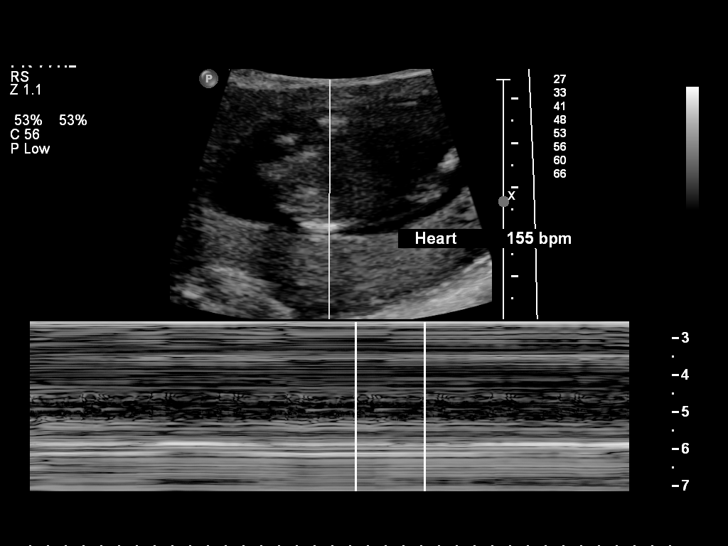
[im 22/32]
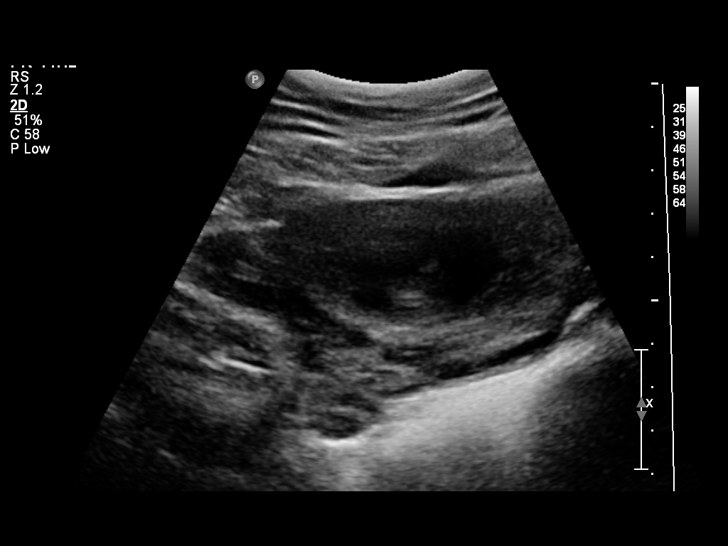
[im 25/32]
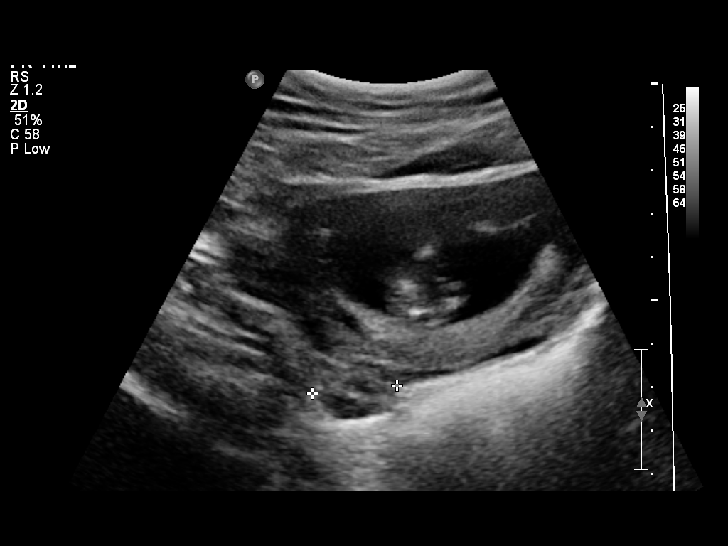
[im 27/32]
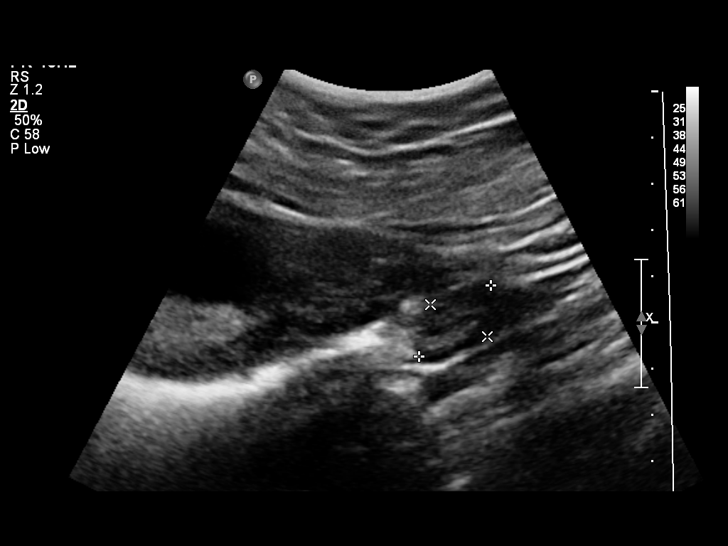
[im 29/32]
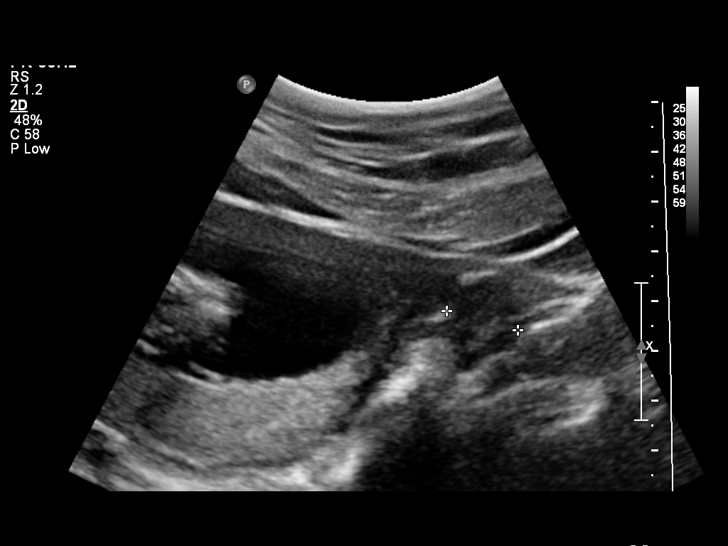
[im 32/32]
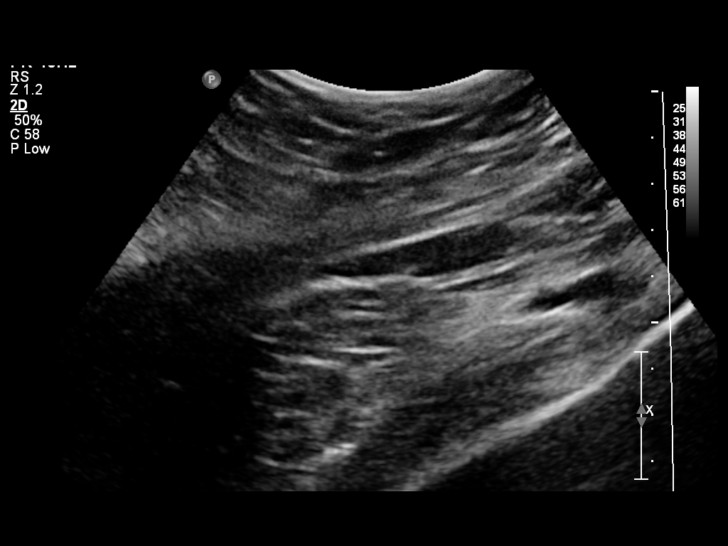

[14 of 28 positions shown; findings below may reference images not displayed]

FINDINGS: Intrauterine gestational sac: Visualized/normal in shape.

Yolk sac:  Not visualized

Embryo:  Visualized

Cardiac Activity: Visualized

Heart Rate: 155 bpm

CRL:   68  mm   13 w 1 d                  US EDC: 10/14/2015

Maternal uterus/adnexae: Both ovaries are normal in appearance. No
evidence of mass or free fluid.
IMPRESSION: Single living IUP measuring 13 weeks 1 day with US EDC of
10/14/2015.

No significant maternal uterine or adnexal abnormality identified.

## 2017-02-26 IMAGING — CT CT HEAD W/O CM
2 series · 15 of 30 positions shown, 17 images · non-contrast
Comparison: Head CT dated 09/13/2014

CLINICAL DATA: 19-year-old female with headache

EXAM:
CT HEAD WITHOUT CONTRAST
TECHNIQUE: Contiguous axial images were obtained from the base of the skull
through the vertex without intravenous contrast.

[Series 2: head without · axial · non-contrast · 0.41mm/px · z∈[-106,+4]mm · 7 of 30 slices shown, 9 images]
[im 4/30  brain]
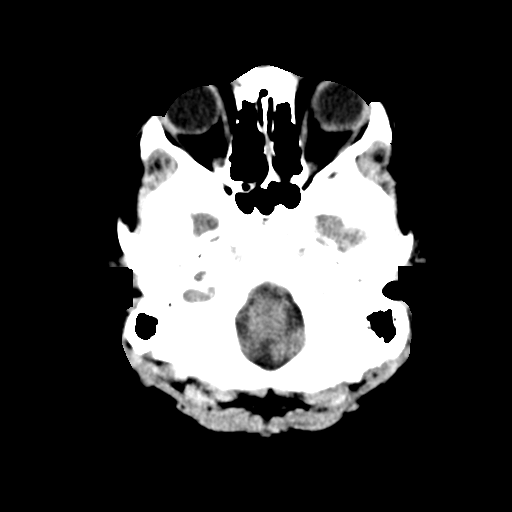
[im 4/30  bone]
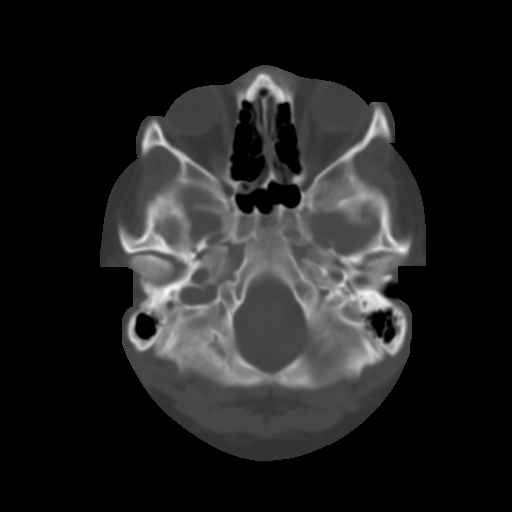
[im 8/30  brain]
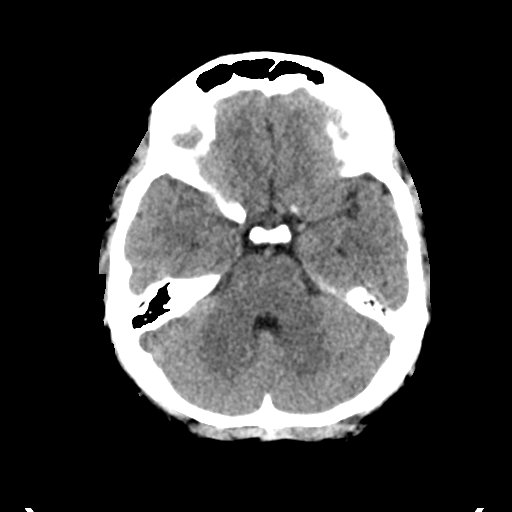
[im 11/30  brain]
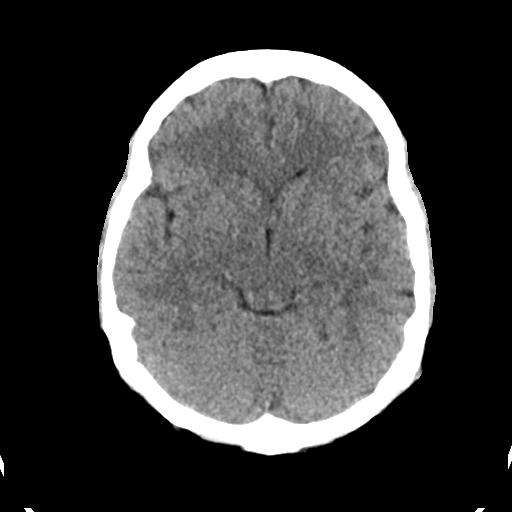
[im 15/30  brain]
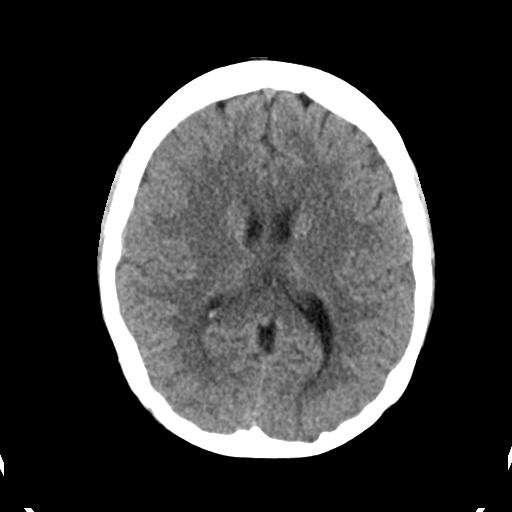
[im 19/30  brain]
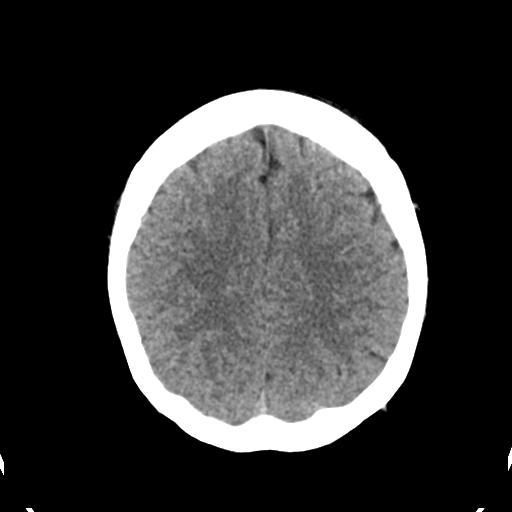
[im 19/30  bone]
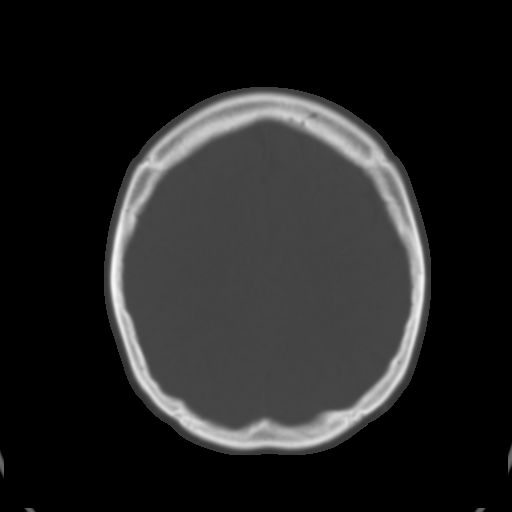
[im 22/30  brain]
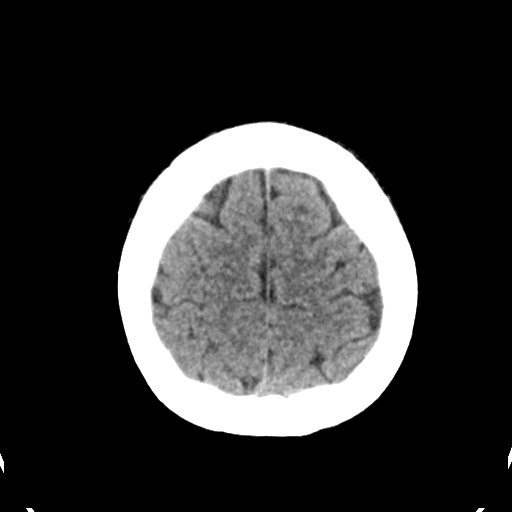
[im 26/30  brain]
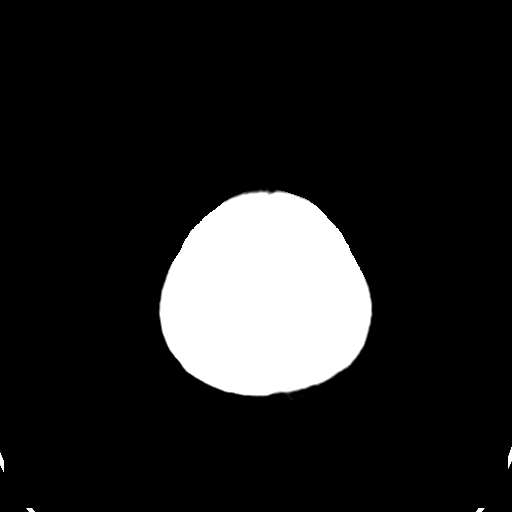

[Series 3: head bone · axial · 0.41mm/px · z∈[-107,+11]mm · 8 of 75 slices shown]
[im 8/75  bone]
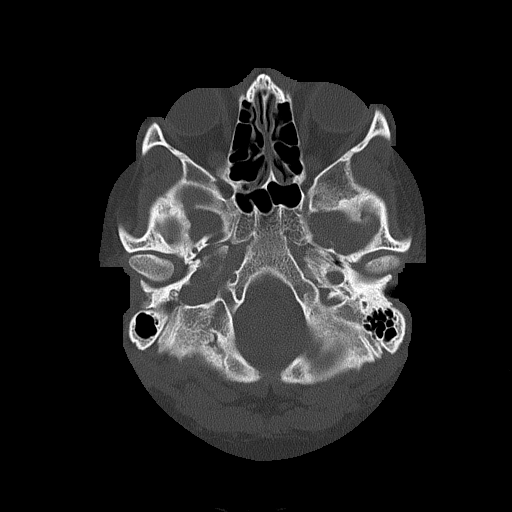
[im 15/75  bone]
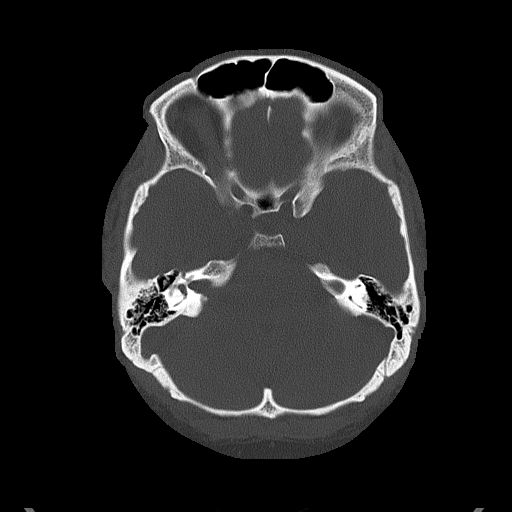
[im 23/75  bone]
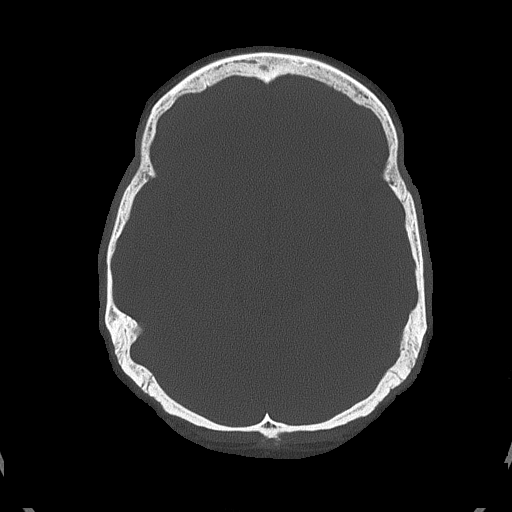
[im 34/75  bone]
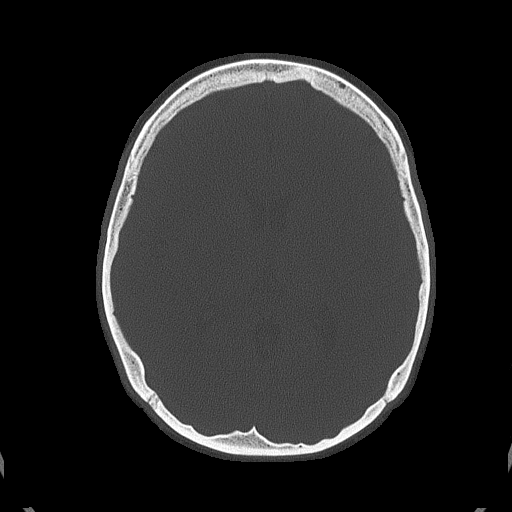
[im 41/75  bone]
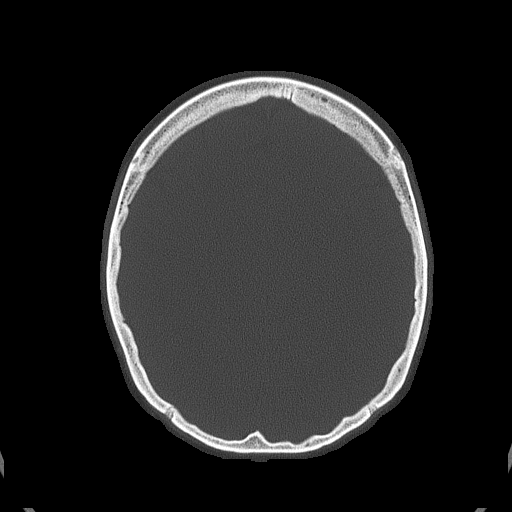
[im 52/75  bone]
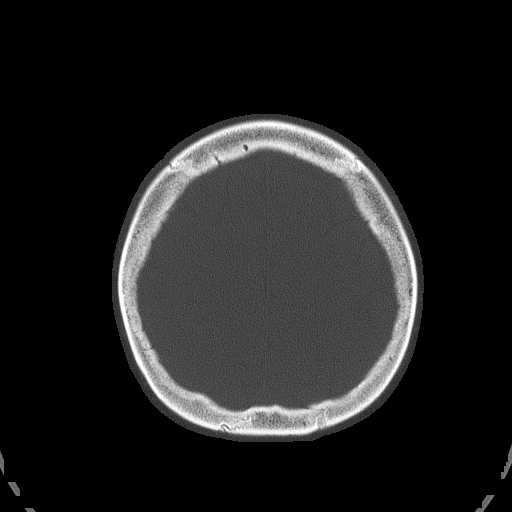
[im 60/75  bone]
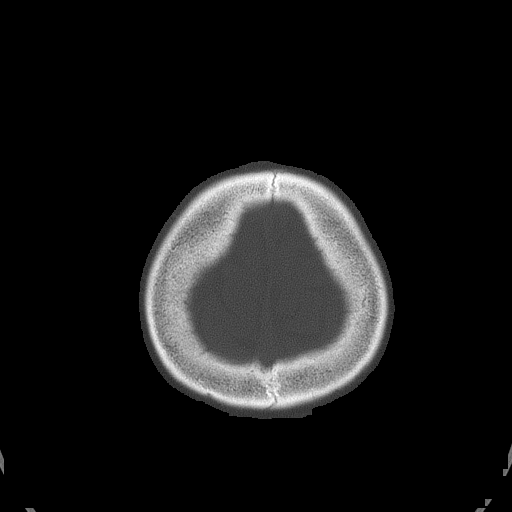
[im 67/75  bone]
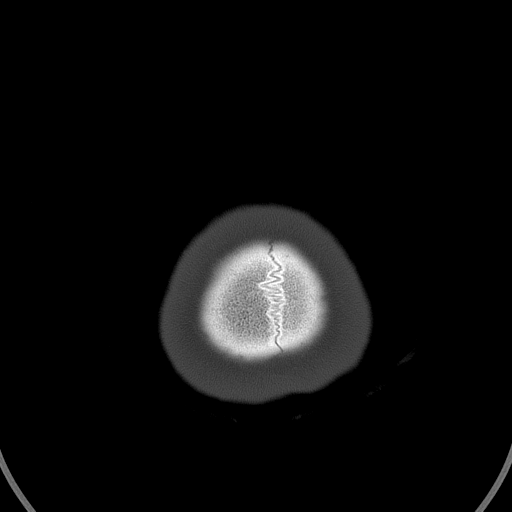

[15 of 30 positions shown; findings below may reference images not displayed]

FINDINGS: The ventricles and the sulci are appropriate in size for the
patient's age. There is no intracranial hemorrhage. No midline shift
or mass effect identified. The gray-white matter differentiation is
preserved.

The visualized paranasal sinuses and mastoid air cells are well
aerated. The calvarium is intact.
IMPRESSION: No acute intracranial pathology.
